# Patient Record
Sex: Male | Born: 1945 | Race: Black or African American | Hispanic: No | Marital: Married | Smoking: Former smoker
Health system: Southern US, Community
[De-identification: ages and names within clinical notes are randomized; demographics above are authoritative.]

## PROBLEM LIST (undated history)

## (undated) DIAGNOSIS — I639 Cerebral infarction, unspecified: Secondary | ICD-10-CM

## (undated) DIAGNOSIS — I10 Essential (primary) hypertension: Secondary | ICD-10-CM

## (undated) DIAGNOSIS — I219 Acute myocardial infarction, unspecified: Secondary | ICD-10-CM

## (undated) DIAGNOSIS — F32A Depression, unspecified: Secondary | ICD-10-CM

## (undated) DIAGNOSIS — R569 Unspecified convulsions: Secondary | ICD-10-CM

## (undated) DIAGNOSIS — M1990 Unspecified osteoarthritis, unspecified site: Secondary | ICD-10-CM

## (undated) DIAGNOSIS — F329 Major depressive disorder, single episode, unspecified: Secondary | ICD-10-CM

## (undated) DIAGNOSIS — I2510 Atherosclerotic heart disease of native coronary artery without angina pectoris: Secondary | ICD-10-CM

## (undated) DIAGNOSIS — K5790 Diverticulosis of intestine, part unspecified, without perforation or abscess without bleeding: Secondary | ICD-10-CM

## (undated) DIAGNOSIS — C801 Malignant (primary) neoplasm, unspecified: Secondary | ICD-10-CM

## (undated) HISTORY — PX: MOUTH SURGERY: SHX715

## (undated) HISTORY — PX: CORONARY ANGIOPLASTY WITH STENT PLACEMENT: SHX49

---

## 2005-07-05 ENCOUNTER — Ambulatory Visit: Payer: Self-pay | Admitting: Internal Medicine

## 2005-07-19 ENCOUNTER — Ambulatory Visit: Payer: Self-pay | Admitting: Internal Medicine

## 2005-11-21 ENCOUNTER — Ambulatory Visit: Payer: Self-pay | Admitting: Family Medicine

## 2009-05-28 DIAGNOSIS — I639 Cerebral infarction, unspecified: Secondary | ICD-10-CM

## 2009-05-28 HISTORY — DX: Cerebral infarction, unspecified: I63.9

## 2011-10-31 ENCOUNTER — Emergency Department (INDEPENDENT_AMBULATORY_CARE_PROVIDER_SITE_OTHER)
Admission: EM | Admit: 2011-10-31 | Discharge: 2011-10-31 | Disposition: A | Discharge status: Nursing Home (Includes ICF) | Payer: Medicare Other | Source: Home / Self Care | Attending: Emergency Medicine | Admitting: Emergency Medicine

## 2011-10-31 ENCOUNTER — Emergency Department (HOSPITAL_COMMUNITY)
Admission: EM | Admit: 2011-10-31 | Discharge: 2011-10-31 | Disposition: A | Discharge status: Home / Self Care | Payer: Medicare Other | Attending: Emergency Medicine | Admitting: Emergency Medicine

## 2011-10-31 ENCOUNTER — Encounter (HOSPITAL_COMMUNITY): Payer: Self-pay

## 2011-10-31 ENCOUNTER — Emergency Department (HOSPITAL_COMMUNITY): Payer: Medicare Other

## 2011-10-31 ENCOUNTER — Encounter (HOSPITAL_COMMUNITY): Payer: Self-pay | Admitting: *Deleted

## 2011-10-31 DIAGNOSIS — N200 Calculus of kidney: Secondary | ICD-10-CM | POA: Insufficient documentation

## 2011-10-31 DIAGNOSIS — I2510 Atherosclerotic heart disease of native coronary artery without angina pectoris: Secondary | ICD-10-CM | POA: Insufficient documentation

## 2011-10-31 DIAGNOSIS — K5732 Diverticulitis of large intestine without perforation or abscess without bleeding: Secondary | ICD-10-CM | POA: Insufficient documentation

## 2011-10-31 DIAGNOSIS — I10 Essential (primary) hypertension: Secondary | ICD-10-CM | POA: Insufficient documentation

## 2011-10-31 DIAGNOSIS — R109 Unspecified abdominal pain: Secondary | ICD-10-CM | POA: Insufficient documentation

## 2011-10-31 DIAGNOSIS — I517 Cardiomegaly: Secondary | ICD-10-CM | POA: Insufficient documentation

## 2011-10-31 DIAGNOSIS — E119 Type 2 diabetes mellitus without complications: Secondary | ICD-10-CM | POA: Insufficient documentation

## 2011-10-31 DIAGNOSIS — K37 Unspecified appendicitis: Secondary | ICD-10-CM

## 2011-10-31 HISTORY — DX: Essential (primary) hypertension: I10

## 2011-10-31 HISTORY — DX: Atherosclerotic heart disease of native coronary artery without angina pectoris: I25.10

## 2011-10-31 LAB — URINALYSIS, ROUTINE W REFLEX MICROSCOPIC
Bilirubin Urine: NEGATIVE
Glucose, UA: NEGATIVE mg/dL
Hgb urine dipstick: NEGATIVE
Ketones, ur: NEGATIVE mg/dL
Protein, ur: NEGATIVE mg/dL
Urobilinogen, UA: 1 mg/dL (ref 0.0–1.0)

## 2011-10-31 LAB — POCT URINALYSIS DIP (DEVICE)
Bilirubin Urine: NEGATIVE
Glucose, UA: NEGATIVE mg/dL
Hgb urine dipstick: NEGATIVE
Nitrite: NEGATIVE
Specific Gravity, Urine: 1.02 (ref 1.005–1.030)
Urobilinogen, UA: 1 mg/dL (ref 0.0–1.0)
pH: 7 (ref 5.0–8.0)

## 2011-10-31 LAB — DIFFERENTIAL
Basophils Relative: 0 % (ref 0–1)
Eosinophils Absolute: 0.3 10*3/uL (ref 0.0–0.7)
Eosinophils Relative: 3 % (ref 0–5)
Lymphs Abs: 3.2 10*3/uL (ref 0.7–4.0)
Monocytes Relative: 7 % (ref 3–12)
Neutrophils Relative %: 56 % (ref 43–77)

## 2011-10-31 LAB — COMPREHENSIVE METABOLIC PANEL
ALT: 19 U/L (ref 0–53)
BUN: 12 mg/dL (ref 6–23)
CO2: 25 mEq/L (ref 19–32)
Calcium: 9.7 mg/dL (ref 8.4–10.5)
Creatinine, Ser: 1.09 mg/dL (ref 0.50–1.35)
GFR calc Af Amer: 81 mL/min — ABNORMAL LOW (ref >90)
GFR calc non Af Amer: 70 mL/min — ABNORMAL LOW (ref >90)
Glucose, Bld: 78 mg/dL (ref 70–99)
Sodium: 137 mEq/L (ref 135–145)
Total Protein: 7.5 g/dL (ref 6.0–8.3)

## 2011-10-31 LAB — CBC
Hemoglobin: 15.5 g/dL (ref 13.0–17.0)
MCH: 32.5 pg (ref 26.0–34.0)
MCHC: 35.1 g/dL (ref 30.0–36.0)
MCV: 92.7 fL (ref 78.0–100.0)
RBC: 4.77 MIL/uL (ref 4.22–5.81)

## 2011-10-31 LAB — LIPASE, BLOOD: Lipase: 36 U/L (ref 11–59)

## 2011-10-31 MED ORDER — METRONIDAZOLE IN NACL 5-0.79 MG/ML-% IV SOLN
500.0000 mg | Freq: Once | INTRAVENOUS | Status: AC
  Administered 2011-10-31: 500 mg via INTRAVENOUS
  Filled 2011-10-31: qty 100

## 2011-10-31 MED ORDER — CIPROFLOXACIN HCL 500 MG PO TABS
500.0000 mg | ORAL_TABLET | Freq: Once | ORAL | Status: AC
  Administered 2011-10-31: 500 mg via ORAL
  Filled 2011-10-31: qty 1

## 2011-10-31 MED ORDER — HYDROMORPHONE HCL PF 1 MG/ML IJ SOLN
1.0000 mg | Freq: Once | INTRAMUSCULAR | Status: AC
  Administered 2011-10-31: 1 mg via INTRAVENOUS
  Filled 2011-10-31: qty 1

## 2011-10-31 MED ORDER — ONDANSETRON HCL 4 MG/2ML IJ SOLN
4.0000 mg | Freq: Once | INTRAMUSCULAR | Status: AC
  Administered 2011-10-31: 4 mg via INTRAVENOUS
  Filled 2011-10-31: qty 2

## 2011-10-31 MED ORDER — IOHEXOL 300 MG/ML  SOLN
100.0000 mL | Freq: Once | INTRAMUSCULAR | Status: AC | PRN
  Administered 2011-10-31: 100 mL via INTRAVENOUS

## 2011-10-31 MED ORDER — CIPROFLOXACIN HCL 500 MG PO TABS: 500.0000 mg | ORAL_TABLET | Freq: Two times a day (BID) | ORAL | Status: AC

## 2011-10-31 MED ORDER — SODIUM CHLORIDE 0.9 % IV SOLN
1000.0000 mL | INTRAVENOUS | Status: DC
  Administered 2011-10-31: 1000 mL via INTRAVENOUS

## 2011-10-31 MED ORDER — SODIUM CHLORIDE 0.9 % IV SOLN
1000.0000 mL | Freq: Once | INTRAVENOUS | Status: AC
  Administered 2011-10-31: 1000 mL via INTRAVENOUS

## 2011-10-31 MED ORDER — METRONIDAZOLE 500 MG PO TABS: 500.0000 mg | ORAL_TABLET | Freq: Two times a day (BID) | ORAL | Status: AC

## 2011-10-31 MED ORDER — IOHEXOL 300 MG/ML  SOLN
20.0000 mL | INTRAMUSCULAR | Status: AC
  Administered 2011-10-31: 20 mL via ORAL

## 2011-10-31 NOTE — ED Notes (Signed)
Pt  Reminded     To  Remain npo

## 2011-10-31 NOTE — ED Notes (Signed)
CT made aware that pt has drank the contrast. Pt aware that we need urine sample.

## 2011-10-31 NOTE — ED Provider Notes (Signed)
  Physical Exam  BP 158/72  Pulse 65  Temp(Src) 98.5 F (36.9 C) (Oral)  Resp 16  SpO2 100%  Physical Exam Patient in no distress.  Awaiting CT scan ED Course  Procedures  MDM Review the CT scan reveals that he has some sigmoid diverticular disease.  Some kidney stones that are sitting in the renal poles or within the bladder, but no obstructing renal calculi.  His urine is negative for any blood.  I discussed this with the patient.  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  Will order nonsteroidals for pain control      Arman Filter, NP 10/31/11 2138

## 2011-10-31 NOTE — ED Provider Notes (Signed)
History   This chart was scribed for Celene Kras, MD by Nicholas Farrell. The patient was seen in room STRE7/STRE7 and the patient's care was started at 7:27PM.  CSN: [id number]  Arrival date & time 10/31/11  1809  Chief Complaint  Patient presents with  . Abdominal Pain   HPI Nicholas Farrell is a 66 y.o. male who presents to the Emergency Department complaining of constant, radiating, moderate to severe abdominal pain with an onset 3 weeks ago. Pain radiates to lower back. Per urgent care notes. Discussed urgent care hx with pt and made following amendments: abd pain is all over, and there was no constipation.  History of Present Illness: The patient is a 66 year old male, resident [name], who presents today with a three-week history of constipation. He had his last bowel movement here at the urgent care Center. He states he has to strain at stool is painful to have bowel movements. His stools have been soft but not diarrheal. He's having one to 2 bowel movements per day without blood or mucus. He also has had a four-day history of intermittent lower abdominal pain, right worse than left. This is described as a burning and is rated as a 7/10 in intensity. It's worse with a bowel movement and better when he lies down. He's felt somewhat nauseated but had no vomiting, fever, or chills.   Past Medical History  Diagnosis Date  . Diabetes mellitus   . Hypertension   . CAD (coronary artery disease)     Past Surgical History  Procedure Date  . Stint     No family history on file.  History  Substance Use Topics  . Smoking status: Former Games developer  . Smokeless tobacco: Not on file  . Alcohol Use: No      Review of Systems 10 Systems reviewed and all are negative for acute change except as noted in the HPI.   Allergies  Review of patient's allergies indicates no known allergies.  Home Medications   Current Outpatient Rx  Name Route Sig Dispense Refill  . ASPIRIN 325 MG PO TABS  Oral Take 325 mg by mouth daily.    Marland Kitchen ESCITALOPRAM OXALATE 10 MG PO TABS Oral Take 10 mg by mouth daily.    Marland Kitchen LISINOPRIL 10 MG PO TABS Oral Take 10 mg by mouth daily.    Marland Kitchen METFORMIN HCL 500 MG PO TABS Oral Take 500 mg by mouth 2 (two) times daily with a meal.    . ADULT MULTIVITAMIN W/MINERALS CH Oral Take 1 tablet by mouth daily.    . TRAZODONE HCL 100 MG PO TABS Oral Take 100 mg by mouth at bedtime.      BP 174/79  Pulse 56  Temp(Src) 98.5 F (36.9 C) (Oral)  Resp 16  SpO2 100%  Physical Exam  Nursing note and vitals reviewed. Constitutional: He appears well-developed and well-nourished. No distress.  HENT:  Head: Normocephalic and atraumatic.  Right Ear: External ear normal.  Left Ear: External ear normal.  Eyes: Conjunctivae are normal. Right eye exhibits no discharge. Left eye exhibits no discharge. No scleral icterus.  Neck: Neck supple. No tracheal deviation present.  Cardiovascular: Normal rate.   Pulmonary/Chest: Effort normal. No stridor. No respiratory distress.  Abdominal: Soft. He exhibits no distension and no mass. There is tenderness (mild epigastric and RLQ). There is no rebound and no guarding.  Musculoskeletal: He exhibits no edema.  Neurological: He is alert. Cranial nerve deficit: no gross deficits.  Skin:  Skin is warm and dry. No rash noted.  Psychiatric: He has a normal mood and affect.    ED Course  Procedures (including critical care time)  DIAGNOSTIC STUDIES: Oxygen Saturation is 99% on room air, normal by my interpretation.    COORDINATION OF CARE:  7:30PM - Urgent Care lab results reviewed. EDMD will order abd CT for the pt.    Labs Reviewed  URINALYSIS, ROUTINE W REFLEX MICROSCOPIC  COMPREHENSIVE METABOLIC PANEL  LIPASE, BLOOD     MDM  On my exam the patient has only mild tenderness in the right lower quadrant. The symptoms are somewhat atypical for appendicitis however the patient was sent down to the emergency room for further testing  to evaluate for appendicitis.  I will add on liver function panels and lipase considering the mild tenderness in the epigastrium. CT abdomen pelvis has been ordered.  The case will be turned over to the oncoming provider to follow up and disposition accordingly.  I personally performed the services described in this documentation, which was scribed in my presence.  The recorded information has been reviewed and considered.         Celene Kras, MD 10/31/11 [phone]

## 2011-10-31 NOTE — ED Notes (Signed)
Pt given contrast to drink. Made aware to notify when complete.

## 2011-10-31 NOTE — ED Notes (Signed)
Lower abdominal pain into  Lower back pain for 3 weeks, denies any vomiting or diarrhea is having nausea

## 2011-10-31 NOTE — ED Notes (Signed)
Pt  Reports  Low  abd  Pain   With  Constipation  -  Had  A  Small  Recently  But  Has  Not  Had  A  Good  One  In about 3  Weeks  -   He  Is  A  Resident  At the  [facility]     He  Has  Been there  About  6 weeks       - he  denys  Any  Vomiting  He  Is  Sitting upright on  Exam table  In no  Severe  Distress

## 2011-10-31 NOTE — Discharge Instructions (Signed)
We have determined that your problem requires further evaluation in the emergency department.  We will take care of your transport there.  Once at the emergency department, you will be evaluated by a provider and they will order whatever treatment or tests they deem necessary.  We cannot guarantee that they will do any specific test or do any specific treatment.    Appendicitis Appendicitis is when the appendix is swollen (inflamed). The inflammation can lead to developing a hole (perforation) and a collection of pus (abscess). CAUSES  There is not always an obvious cause of appendicitis. Sometimes it is caused by an obstruction in the appendix. The obstruction can be caused by:  A small, hard, pea-sized ball of stool (fecalith).   Enlarged lymph glands in the appendix.  SYMPTOMS   Pain around your belly button (navel) that moves toward your lower right belly (abdomen). The pain can become more severe and sharp as time passes.   Tenderness in the lower right abdomen. Pain gets worse if you cough or make a sudden movement.   Feeling sick to your stomach (nauseous).   Throwing up (vomiting).   Loss of appetite.   Fever.   Constipation.   Diarrhea.   Generally not feeling well.  DIAGNOSIS   Physical exam.   Blood tests.   Urine test.   X-rays or a CT scan may confirm the diagnosis.  TREATMENT  Once the diagnosis of appendicitis is made, the most common treatment is to remove the appendix as soon as possible. This procedure is called appendectomy. In an open appendectomy, a cut (incision) is made in the lower right abdomen and the appendix is removed. In a laparoscopic appendectomy, usually 3 small incisions are made. Long, thin instruments and a camera tube are used to remove the appendix. Most patients go home in 24 to 28 hours after appendectomy. In some situations, the appendix may have already perforated and an abscess may have formed. The abscess may have a "wall" around it  as seen on a CT scan. In this case, a drain may be placed into the abscess and you may be treated with medicines (antibiotics) that kill germs. Once the abscess has resolved, it may or may not be necessary to have an appendectomy. Document Released: 05/14/2005 Document Revised: 05/03/2011 Document Reviewed: 08/09/2009 [employer] Patient Information 2012 [employer]. 

## 2011-10-31 NOTE — ED Provider Notes (Signed)
Chief Complaint  Patient presents with  . Constipation    History of Present Illness:   The patient is a 66 year old male, resident [name], who presents today with a three-week history of constipation. He had his last bowel movement here at the urgent care Center. He states he has to strain at stool is painful to have bowel movements. His stools have been soft but not diarrheal. He's having one to 2 bowel movements per day without blood or mucus. He also has had a four-day history of intermittent lower abdominal pain, right worse than left. This is described as a burning and is rated as a 7/10 in intensity. It's worse with a bowel movement and better when he lies down. He's felt somewhat nauseated but had no vomiting, fever, or chills.  Review of Systems:  Other than noted above, the patient denies any of the following symptoms: Constitutional:  No fever, chills, fatigue, weight loss or anorexia. Lungs:  No cough or shortness of breath. Heart:  No chest pain, palpitations, syncope or edema.  No cardiac history. Abdomen:  No nausea, vomiting, hematememesis, melena, diarrhea, or hematochezia. GU:  No dysuria, frequency, urgency, or hematuria.  No testicular pain or swelling. Skin:  No rash or itching.  PMFSH:  Past medical history, family history, social history, meds, and allergies were reviewed.  No prior abdominal surgeries or history of GI problems.  No use of NSAIDs or aspirin.  No excessive  alcohol intake.  Physical Exam:   Vital signs:  BP 167/79  Pulse 80  Temp(Src) 98.3 F (36.8 C) (Oral)  Resp 18  SpO2 99% Gen:  Alert, oriented, in no distress. Lungs:  Breath sounds clear and equal bilaterally.  No wheezes, rales or rhonchi. Heart:  Regular rhythm.  No gallops or murmers.   Abdomen:  Abdomen is soft, flat, nondistended. He has localized tenderness to palpation in the right lower quadrant with guarding and rebound. There is no organomegaly or mass. Bowel sounds are normally  active. Genital exam:  Normal genitalia, no hernia, testes normal. Rectal exam: No masses, tenderness, prostate enlargement, or prostate nodules. Stool was heme-negative. Skin:  Clear, warm and dry.  No rash.  Labs:   Results for orders placed during the hospital encounter of 10/31/11  POCT URINALYSIS DIP (DEVICE)      Component Value Range   Glucose, UA NEGATIVE  NEGATIVE (mg/dL)   Bilirubin Urine NEGATIVE  NEGATIVE    Ketones, ur NEGATIVE  NEGATIVE (mg/dL)   Specific Gravity, Urine 1.020  1.005 - 1.030    Hgb urine dipstick NEGATIVE  NEGATIVE    pH 7.0  5.0 - 8.0    Protein, ur 30 (*) NEGATIVE (mg/dL)   Urobilinogen, UA 1.0  0.0 - 1.0 (mg/dL)   Nitrite NEGATIVE  NEGATIVE    Leukocytes, UA NEGATIVE  NEGATIVE   CBC      Component Value Range   WBC 9.5  4.0 - 10.5 (K/uL)   RBC 4.77  4.22 - 5.81 (MIL/uL)   Hemoglobin 15.5  13.0 - 17.0 (g/dL)   HCT 16.1  09.6 - 04.5 (%)   MCV 92.7  78.0 - 100.0 (fL)   MCH 32.5  26.0 - 34.0 (pg)   MCHC 35.1  30.0 - 36.0 (g/dL)   RDW 40.9  81.1 - 91.4 (%)   Platelets 173  150 - 400 (K/uL)  DIFFERENTIAL      Component Value Range   Neutrophils Relative 56  43 - 77 (%)  Neutro Abs 5.3  1.7 - 7.7 (K/uL)   Lymphocytes Relative 34  12 - 46 (%)   Lymphs Abs 3.2  0.7 - 4.0 (K/uL)   Monocytes Relative 7  3 - 12 (%)   Monocytes Absolute 0.7  0.1 - 1.0 (K/uL)   Eosinophils Relative 3  0 - 5 (%)   Eosinophils Absolute 0.3  0.0 - 0.7 (K/uL)   Basophils Relative 0  0 - 1 (%)   Basophils Absolute 0.0  0.0 - 0.1 (K/uL)    Assessment:  The encounter diagnosis was Appendicitis.  Plan:   1.  The following meds were prescribed:   New Prescriptions   No medications on file   2.  The patient was transported to the emergency department via shuttle.  Reuben Likes, MD 10/31/11 [phone]

## 2011-10-31 NOTE — Discharge Instructions (Signed)
Low Fiber and Residue Restricted Diet A low fiber diet restricts foods that contain carbohydrates that are not digested in the small intestine. A diet containing about 10 g of fiber is considered low fiber. The diet needs to be individualized to suit patient tolerances and preferences and to avoid unnecessary restrictions. Generally, the foods emphasized in a low fiber diet have no skins or seeds. They may have been processed to remove bran, germ, or husks. Cooking may not necessarily eliminate the fiber. Cooking may, in fact, enable a greater quantity of fiber to be consumed in a lesser volume. Legumes and nuts are also restricted. The term low residue has also been used to describe low fiber diets, although the two are not the same. Residue refers to any substance that adds to bowel (colonic) contents, such as sloughed cells and intestinal bacteria, in addition to fiber. Residue-containing foods, prunes and prune juice, milk, and connective tissue from meats may also need to be eliminated. It is important to eliminate these foods during sudden (acute) attacks of inflammatory bowel disease, when there is a partial obstruction due to another reason, or when minimal fecal output is desired. When these problems are gone, a more normal diet may be used. PURPOSE  Prevent blockage of a partially obstructed or narrowed gastrointestinal tract.   Reduce stool weight and volume.   Slow the movement of waste.  WHEN IS THIS DIET USED?  Acute phase of Crohn's disease, ulcerative colitis, regional enteritis, or diverticulitis.   Narrowing (stenosis) of intestinal or esophageal tubes (lumina).   Transitional diet following surgery, injury (trauma), or illness.  ADEQUACY This diet is nutritionally adequate based on individual food choices according to the Recommended Dietary Allowances of the National Research Council. CHOOSING FOODS Check labels, especially on foods from the starch list. Often, dietary fiber  content is listed with the Nutrition Facts panel.  Breads and Starches  Allowed: White, French, and pita breads, plain rolls, buns, or sweet rolls, doughnuts, waffles, pancakes, bagels. Plain muffins, sweet breads, biscuits, matzoth. Flour. Soda, saltine, or graham crackers. Pretzels, rusks, melba toast, zwieback. Cooked cereals: cornmeal, farina, cream cereals. Dry cereals: refined corn, wheat, rice, and oat cereals (check label). Potatoes prepared any way without skins, refined macaroni, spaghetti, noodles, refined rice.   Avoid: Bread, rolls, or crackers made with whole-wheat, multigrains, rye, bran seeds, nuts, or coconut. Corn tortillas, table-shells. Corn chips, tortilla chips. Cereals containing whole-grains, multigrains, bran, coconut, nuts, or raisins. Cooked or dry oatmeal. Coarse wheat cereals, granola. Cereals advertised as "high fiber." Potato skins. Whole-grain pasta, wild or brown rice. Popcorn.  Vegetables  Allowed:  Strained tomato and vegetable juices. Fresh: tender lettuce, cucumber, cabbage, spinach, bean sprouts. Cooked, canned: asparagus, bean sprouts, cut green or wax beans, cauliflower, pumpkin, beets, mushrooms, olives, spinach, yellow squash, tomato, tomato sauce (no seeds), zucchini (peeled), turnips. Canned sweet potatoes. Small amounts of celery, onion, radish, and green pepper may be used. Keep servings limited to  cup.   Avoid: Fresh, cooked, or canned: artichokes, baked beans, beet greens, broccoli, Brussels sprouts, French-style green beans, corn, kale, legumes, peas, sweet potatoes. Cooked: green or red cabbage, spinach. Avoid large servings of any vegetables.  Fruit  Allowed:  All fruit juices except prune juice. Cooked or canned: apricots applesauce, cantaloupe, cherries, grapefruit, grapes, kiwi, mandarin oranges, peaches, pears, fruit cocktail, pineapple, plums, watermelon. Fresh: banana, grapes, cantaloupe, avocado, cherries, pineapple, grapefruit, kiwi,  nectarines, peaches, oranges, blueberries, plums. Keep servings limited to  cup or 1 piece.     Avoid: Fresh: apple with or without skin, apricots, mango, pears, raspberries, strawberries. Prune juice, stewed or dried prunes. Dried fruits, raisins, dates. Avoid large servings of all fresh fruits.  Meat and Meat Substitutes  Allowed:  Ground or well-cooked tender beef, ham, veal, lamb, pork, or poultry. Eggs, plain cheese. Fish, oysters, shrimp, lobster, other seafood. Liver, organ meats.   Avoid: Tough, fibrous meats with gristle. Peanut butter, smooth or chunky. Cheese with seeds, nuts, or other foods not allowed. Nuts, seeds, legumes, dried peas, beans, lentils.  Milk  Allowed:  All milk products except those not allowed. Milk and milk product consumption should be minimal when low residue is desired.   Avoid: Yogurt that contains nuts or seeds.  Soups and Combination Foods  Allowed:  Bouillon, broth, or cream soups made from allowed foods. Any strained soup. Casseroles or mixed dishes made with allowed foods.   Avoid: Soups made from vegetables that are not allowed or that contain other foods not allowed.  Desserts and Sweets  Allowed:  Plain cakes and cookies, pie made with allowed fruit, pudding, custard, cream pie. Gelatin, fruit, ice, sherbet, frozen ice pops. Ice cream, ice milk without nuts. Plain hard candy, honey, jelly, molasses, syrup, sugar, chocolate syrup, gumdrops, marshmallows.   Avoid: Desserts, cookies, or candies that contain nuts, peanut butter, or dried fruits. Jams, preserves with seeds, marmalade.  Fats and Oils  Allowed:  Margarine, butter, cream, mayonnaise, salad oils, plain salad dressings made from allowed foods. Plain gravy, crisp bacon without rind.   Avoid: Seeds, nuts, olives. Avocados.  Beverages  Allowed:  All, except those listed to avoid.   Avoid: Fruit juices with high pulp, prune juice.  Condiments  Allowed:  Ketchup, mustard, horseradish,  vinegar, cream sauce, cheese sauce, cocoa powder. Spices in moderation: allspice, basil, bay leaves, celery powder or leaves, cinnamon, cumin powder, curry powder, ginger, mace, marjoram, onion or garlic powder, oregano, paprika, parsley flakes, ground pepper, rosemary, sage, savory, tarragon, thyme, turmeric.   Avoid: Coconut, pickles.  SAMPLE MEAL PLAN The following menu is provided as a sample. Your daily menu plans will vary. Be sure to include a minimum of the following each day in order to provide essential nutrients for the adult:  Starch/Bread/Cereal Group, 6 servings.   Fruit/Vegetable Group, 5 servings.   Meat/Meat Substitute Group, 2 servings.   Milk/Milk Substitute Group, 2 servings.  A serving is equal to  cup for fruits, vegetables, and cooked cereals or 1 piece for foods such as a piece of bread, 1 orange, or 1 apple. For dry cereals and crackers, use serving sizes listed on the label. Combination foods may count as full or partial servings from various food groups. Fats, desserts, and sweets may be added to the meal plan after the requirements for essential nutrients are met. SAMPLE MENU Breakfast   cup orange juice.   1 boiled egg.   1 slice white toast.   Margarine.    cup cornflakes.   1 cup milk.   Beverage.  Lunch   cup chicken noodle soup.   2 to 3 oz sliced roast beef.   2 slices seedless rye bread.   Mayonnaise.    cup tomato juice.   1 small banana.   Beverage.  Dinner  3 oz baked chicken.    cup scalloped potatoes.    cup cooked beets.   White dinner roll.   Margarine.    cup canned peaches.   Beverage.  Document Released: 11/03/2001 Document Revised: 05/03/2011   Document Reviewed: 04/16/2011 [facility] Patient Information 2012 [place], [place].Diverticulitis Small pockets or "bubbles" can develop in the wall of the intestine. Diverticulitis is when those pockets become infected and inflamed. This causes stomach pain (usually  on the left side). HOME CARE  Take all medicine as told by your doctor.   Try a clear liquid diet (broth, tea, or water) for as long as told by your doctor.   Keep all follow-up visits with your doctor.   You may be put on a low-fiber diet once you start feeling better. Here are foods that have low-fiber:   White breads, cereals, rice, and pasta.   Cooked fruits and vegetables or soft fresh fruits and vegetables without the skin.   Ground or well-cooked tender beef, ham, veal, lamb, pork, or poultry.   Eggs and seafood.   After you are doing well on the low-fiber diet, you may be put on a high-fiber diet. Here are ways to increase your fiber:   Choose whole-grain breads, cereals, pasta, and brown rice.   Choose fruits and vegetables with skin on. Do not overcook the vegetables.   Choose nuts, seeds, legumes, dried peas, beans, and lentils.   Look for food products that have more than 3 grams of fiber per serving on the food label.  GET HELP RIGHT AWAY IF:  Your pain does not get better or gets worse.   You have trouble eating food.   You are not pooping (having bowel movements) like normal.   You have a temperature by mouth above 102 F (38.9 C), not controlled by medicine.   You keep throwing up (vomiting).   You have bloody or black, tarry poop (stools).   You are getting worse and not better.  MAKE SURE YOU:   Understand these instructions.   Will watch your condition.   Will get help right away if you are not doing well or get worse.  Document Released: 10/31/2007 Document Revised: 05/03/2011 Document Reviewed: 04/04/2009 [facility] Patient Information 2012 [place], [place]. As discussed.  She also had multiple kidney stones within your urinary system.  Nothing is obstructing any of one large stone sitting in your bladder, which cannot push any problemsKidney Stones Kidney stones (ureteral lithiasis) are deposits that form inside your kidneys. The intense pain  is caused by the stone moving through the urinary tract. When the stone moves, the ureter goes into spasm around the stone. The stone is usually passed in the urine.  CAUSES   A disorder that makes certain neck glands produce too much parathyroid hormone (primary hyperparathyroidism).   A buildup of uric acid crystals.   Narrowing (stricture) of the ureter.   A kidney obstruction present at birth (congenital obstruction).   Previous surgery on the kidney or ureters.   Numerous kidney infections.  SYMPTOMS   Feeling sick to your stomach (nauseous).   Throwing up (vomiting).   Blood in the urine (hematuria).   Pain that usually spreads (radiates) to the groin.   Frequency or urgency of urination.  DIAGNOSIS   Taking a history and physical exam.   Blood or urine tests.   Computerized X-ray scan (CT scan).   Occasionally, an examination of the inside of the urinary bladder (cystoscopy) is performed.  TREATMENT   Observation.   Increasing your fluid intake.   Surgery may be needed if you have severe pain or persistent obstruction.  The size, location, and chemical composition are all important variables that will determine the proper choice of  action for you. Talk to your caregiver to better understand your situation so that you will minimize the risk of injury to yourself and your kidney.  HOME CARE INSTRUCTIONS   Drink enough water and fluids to keep your urine clear or pale yellow.   Strain all urine through the provided strainer. Keep all particulate matter and stones for your caregiver to see. The stone causing the pain may be as small as a grain of salt. It is very important to use the strainer each and every time you pass your urine. The collection of your stone will allow your caregiver to analyze it and verify that a stone has actually passed.   Only take over-the-counter or prescription medicines for pain, discomfort, or fever as directed by your caregiver.    Make a follow-up appointment with your caregiver as directed.   Get follow-up X-rays if required. The absence of pain does not always mean that the stone has passed. It may have only stopped moving. If the urine remains completely obstructed, it can cause loss of kidney function or even complete destruction of the kidney. It is your responsibility to make sure X-rays and follow-ups are completed. Ultrasounds of the kidney can show blockages and the status of the kidney. Ultrasounds are not associated with any radiation and can be performed easily in a matter of minutes.  SEEK IMMEDIATE MEDICAL CARE IF:   Pain cannot be controlled with the prescribed medicine.   You have a fever.   The severity or intensity of pain increases over 18 hours and is not relieved by pain medicine.   You develop a new onset of abdominal pain.   You feel faint or pass out.  MAKE SURE YOU:   Understand these instructions.   Will watch your condition.   Will get help right away if you are not doing well or get worse.  Document Released: 05/14/2005 Document Revised: 05/03/2011 Document Reviewed: 09/09/2009 [facility] Patient Information 2012 [place], [place].

## 2011-11-01 NOTE — ED Provider Notes (Signed)
Medical screening examination/treatment/procedure(s) were conducted as a shared visit with non-physician practitioner(s) and myself.  I personally evaluated the patient during the encounter   Celene Kras, MD 11/01/11 1104

## 2011-11-02 ENCOUNTER — Encounter (HOSPITAL_COMMUNITY): Payer: Self-pay | Admitting: Emergency Medicine

## 2011-11-02 ENCOUNTER — Emergency Department (HOSPITAL_COMMUNITY)
Admission: EM | Admit: 2011-11-02 | Discharge: 2011-11-02 | Disposition: A | Discharge status: Home / Self Care | Payer: Medicare Other | Attending: Emergency Medicine | Admitting: Emergency Medicine

## 2011-11-02 DIAGNOSIS — N200 Calculus of kidney: Secondary | ICD-10-CM | POA: Insufficient documentation

## 2011-11-02 DIAGNOSIS — Z79899 Other long term (current) drug therapy: Secondary | ICD-10-CM | POA: Insufficient documentation

## 2011-11-02 DIAGNOSIS — I10 Essential (primary) hypertension: Secondary | ICD-10-CM | POA: Insufficient documentation

## 2011-11-02 DIAGNOSIS — I2510 Atherosclerotic heart disease of native coronary artery without angina pectoris: Secondary | ICD-10-CM | POA: Insufficient documentation

## 2011-11-02 DIAGNOSIS — E119 Type 2 diabetes mellitus without complications: Secondary | ICD-10-CM | POA: Insufficient documentation

## 2011-11-02 DIAGNOSIS — K5792 Diverticulitis of intestine, part unspecified, without perforation or abscess without bleeding: Secondary | ICD-10-CM

## 2011-11-02 DIAGNOSIS — K5732 Diverticulitis of large intestine without perforation or abscess without bleeding: Secondary | ICD-10-CM | POA: Insufficient documentation

## 2011-11-02 LAB — CBC
HCT: 43.7 % (ref 39.0–52.0)
MCH: 32.5 pg (ref 26.0–34.0)
MCV: 91 fL (ref 78.0–100.0)
RBC: 4.8 MIL/uL (ref 4.22–5.81)
WBC: 9.1 10*3/uL (ref 4.0–10.5)

## 2011-11-02 LAB — URINALYSIS, ROUTINE W REFLEX MICROSCOPIC
Hgb urine dipstick: NEGATIVE
Nitrite: NEGATIVE
Protein, ur: NEGATIVE mg/dL
Specific Gravity, Urine: 1.028 (ref 1.005–1.030)
Urobilinogen, UA: 0.2 mg/dL (ref 0.0–1.0)

## 2011-11-02 LAB — BASIC METABOLIC PANEL
CO2: 24 mEq/L (ref 19–32)
Chloride: 102 mEq/L (ref 96–112)
Creatinine, Ser: 1.03 mg/dL (ref 0.50–1.35)
Glucose, Bld: 80 mg/dL (ref 70–99)
Sodium: 136 mEq/L (ref 135–145)

## 2011-11-02 LAB — URINE MICROSCOPIC-ADD ON

## 2011-11-02 MED ORDER — CIPROFLOXACIN IN D5W 400 MG/200ML IV SOLN
400.0000 mg | Freq: Once | INTRAVENOUS | Status: AC
  Administered 2011-11-02: 400 mg via INTRAVENOUS
  Filled 2011-11-02: qty 200

## 2011-11-02 MED ORDER — METRONIDAZOLE IN NACL 5-0.79 MG/ML-% IV SOLN
500.0000 mg | Freq: Once | INTRAVENOUS | Status: AC
  Administered 2011-11-02: 500 mg via INTRAVENOUS
  Filled 2011-11-02: qty 100

## 2011-11-02 NOTE — ED Provider Notes (Signed)
History     CSN: [id number]  Arrival date & time 11/02/11  1514   First MD Initiated Contact with Patient 11/02/11 1926      Chief Complaint  Patient presents with  . Abdominal Pain    (Consider location/radiation/quality/duration/timing/severity/associated sxs/prior treatment) HPI  66 year old male with history of diabetes, history of hypertension, and a recent diagnosis of diverticulitis and nonobstructed kidney stones presents complaining of low abdominal pain. Patient states for the past 3 weeks he has been having intermittent pain to his abdomen. Pain is associated with intermittent diarrhea. Patient denies fever, chills, nausea, vomiting, urinary symptoms. He was seen in the ED 2 days ago for evaluation of his symptoms. He was found to have evidence of acute early onset of diverticulitis. He was given Cipro and Flagyl as treatment. Since patient is staying at a drug rehabilitation facility, no narcotic pain medication will prescribe. Patient states he has been taking his medication for the past 2 days but has not noticed any improvement. He denies any other changes since he was last seen. He is able to target by mouth. His last bowel movement was earlier today, and it was normal.     Past Medical History  Diagnosis Date  . Diabetes mellitus   . Hypertension   . CAD (coronary artery disease)     Past Surgical History  Procedure Date  . Stint     History reviewed. No pertinent family history.  History  Substance Use Topics  . Smoking status: Former Smoker    Quit date: 09/24/2011  . Smokeless tobacco: Not on file  . Alcohol Use: No      Review of Systems  All other systems reviewed and are negative.    Allergies  Review of patient's allergies indicates no known allergies.  Home Medications   Current Outpatient Rx  Name Route Sig Dispense Refill  . ASPIRIN 325 MG PO TABS Oral Take 325 mg by mouth daily.    Marland Kitchen CIPROFLOXACIN HCL 500 MG PO TABS Oral Take 1  tablet (500 mg total) by mouth 2 (two) times daily. 20 tablet 0  . ESCITALOPRAM OXALATE 10 MG PO TABS Oral Take 10 mg by mouth daily.    Marland Kitchen LISINOPRIL 10 MG PO TABS Oral Take 10 mg by mouth daily.    Marland Kitchen METFORMIN HCL 500 MG PO TABS Oral Take 500 mg by mouth 2 (two) times daily with a meal.    . METRONIDAZOLE 500 MG PO TABS Oral Take 1 tablet (500 mg total) by mouth 2 (two) times daily. 14 tablet 0  . ADULT MULTIVITAMIN W/MINERALS CH Oral Take 1 tablet by mouth daily.    . TRAZODONE HCL 100 MG PO TABS Oral Take 100 mg by mouth at bedtime.      BP 128/69  Pulse 72  Temp(Src) 98.3 F (36.8 C) (Oral)  Resp 18  SpO2 98%  Physical Exam  Nursing note and vitals reviewed. Constitutional: He is oriented to person, place, and time. He appears well-developed and well-nourished. No distress.  HENT:  Head: Normocephalic and atraumatic.  Mouth/Throat: No oropharyngeal exudate.  Eyes: Conjunctivae are normal.  Neck: Neck supple.  Cardiovascular: Regular rhythm.  Exam reveals no gallop and no friction rub.   No murmur heard. Pulmonary/Chest: Effort normal and breath sounds normal. No respiratory distress. He has no wheezes. He exhibits no tenderness.  Abdominal: Soft. There is generalized tenderness. There is no rigidity, no guarding, no CVA tenderness, no tenderness at McBurney's point and negative  Murphy's sign. No hernia. Hernia confirmed negative in the ventral area.  Musculoskeletal: Normal range of motion.  Neurological: He is alert and oriented to person, place, and time.  Skin: Skin is warm. No rash noted.    ED Course  Procedures (including critical care time)  Labs Reviewed  URINALYSIS, ROUTINE W REFLEX MICROSCOPIC - Abnormal; Notable for the following:    Leukocytes, UA SMALL (*)    All other components within normal limits  URINE MICROSCOPIC-ADD ON  CBC  BASIC METABOLIC PANEL   Ct Abdomen Pelvis W Contrast  10/31/2011  *RADIOLOGY REPORT*  Clinical Data: Mid to lower abdominal  pain.  CT ABDOMEN AND PELVIS WITH CONTRAST  Technique:  Multidetector CT imaging of the abdomen and pelvis was performed following the standard protocol during bolus administration of intravenous contrast.  Contrast: OMNIPAQUE IOHEXOL 300 MG/ML  SOLN  Comparison: No priors.  Findings:  Lung Bases: Atherosclerotic calcifications in the left anterior descending, left circumflex and right coronary arteries. Mild cardiomegaly.  Otherwise, unremarkable.  Abdomen/Pelvis:  Enhanced appearance of the liver, gallbladder, spleen and bilateral adrenal glands is unremarkable.  There are some punctate calcifications in the tail of the pancreas which are nonspecific, but are favored to represent sequelae of chronic pancreatitis.  The remainder the pancreas is otherwise unremarkable in appearance.  There are multiple tiny calcifications in the collecting systems of the kidneys bilaterally, favored to represent nonobstructive calculi.  The largest of these is in the interpolar region of the right kidney measuring 4 mm.  A small splenule is incidentally noted.  There is atherosclerosis of the abdominal and pelvic vasculature, without definite focal aneurysm or dissection.  The appendix is normal.  There is no ascites or pneumoperitoneum and no pathologic distension of bowel.  No definite pathologic lymphadenopathy identified within the abdomen or pelvis.  However, there is extensive colonic diverticulosis, most severe in the descending colon and sigmoid colon.  Notably, there is extensive wall thickening throughout the sigmoid colon, particularly proximally, and there is some subtle inflammatory changes in the associated sigmoid mesocolon (best demonstrated on image 60 of series 2), suggestive of early acute diverticulitis.  No definite diverticular abscess or evidence to suggest perforation at this time.  Prostate is unremarkable in appearance.  In the left side of the urinary bladder dependently there is a 9 mm calculus.   Musculoskeletal: There are no aggressive appearing lytic or blastic lesions noted in the visualized portions of the skeleton.  IMPRESSION: 1.  Findings, as above, compatible with early acute diverticulitis in the proximal sigmoid colon. 2.  The multiple bilateral nonobstructive calculi are noted within the collecting systems of the kidneys.  In addition, there is a 9 mm calculus in the left side of the urinary bladder.  3. Atherosclerosis, including left anterior descending, left circumflex and right coronary artery disease. Please note that although the presence of coronary artery calcium documents the presence of coronary artery disease, the severity of this disease and any potential stenosis cannot be assessed on this non-gated CT examination.  Assessment for potential risk factor modification, dietary therapy or pharmacologic therapy may be warranted, if clinically indicated. 4.  Mild cardiomegaly.  Original Report Authenticated By: Florencia Reasons, M.D.     No diagnosis found.  Results for orders placed during the hospital encounter of 11/02/11  URINALYSIS, ROUTINE W REFLEX MICROSCOPIC      Component Value Range   Color, Urine YELLOW  YELLOW    APPearance CLEAR  CLEAR  Specific Gravity, Urine 1.028  1.005 - 1.030    pH 5.0  5.0 - 8.0    Glucose, UA NEGATIVE  NEGATIVE (mg/dL)   Hgb urine dipstick NEGATIVE  NEGATIVE    Bilirubin Urine NEGATIVE  NEGATIVE    Ketones, ur NEGATIVE  NEGATIVE (mg/dL)   Protein, ur NEGATIVE  NEGATIVE (mg/dL)   Urobilinogen, UA 0.2  0.0 - 1.0 (mg/dL)   Nitrite NEGATIVE  NEGATIVE    Leukocytes, UA SMALL (*) NEGATIVE   URINE MICROSCOPIC-ADD ON      Component Value Range   Squamous Epithelial / LPF RARE  RARE    WBC, UA 0-2  <3 (WBC/hpf)   RBC / HPF 0-2  <3 (RBC/hpf)   Bacteria, UA RARE  RARE    Urine-Other MUCOUS PRESENT    BASIC METABOLIC PANEL      Component Value Range   Sodium 136  135 - 145 (mEq/L)   Potassium 3.7  3.5 - 5.1 (mEq/L)   Chloride 102   96 - 112 (mEq/L)   CO2 24  19 - 32 (mEq/L)   Glucose, Bld 80  70 - 99 (mg/dL)   BUN 11  6 - 23 (mg/dL)   Creatinine, Ser 7.82  0.50 - 1.35 (mg/dL)   Calcium 9.5  8.4 - 95.6 (mg/dL)   GFR calc non Af Amer 75 (*) >90 (mL/min)   GFR calc Af Amer 87 (*) >90 (mL/min)  CBC      Component Value Range   WBC 9.1  4.0 - 10.5 (K/uL)   RBC 4.80  4.22 - 5.81 (MIL/uL)   Hemoglobin 15.6  13.0 - 17.0 (g/dL)   HCT 21.3  08.6 - 57.8 (%)   MCV 91.0  78.0 - 100.0 (fL)   MCH 32.5  26.0 - 34.0 (pg)   MCHC 35.7  30.0 - 36.0 (g/dL)   RDW 46.9  62.9 - 52.8 (%)   Platelets 186  150 - 400 (K/uL)   Ct Abdomen Pelvis W Contrast  10/31/2011  *RADIOLOGY REPORT*  Clinical Data: Mid to lower abdominal pain.  CT ABDOMEN AND PELVIS WITH CONTRAST  Technique:  Multidetector CT imaging of the abdomen and pelvis was performed following the standard protocol during bolus administration of intravenous contrast.  Contrast: OMNIPAQUE IOHEXOL 300 MG/ML  SOLN  Comparison: No priors.  Findings:  Lung Bases: Atherosclerotic calcifications in the left anterior descending, left circumflex and right coronary arteries. Mild cardiomegaly.  Otherwise, unremarkable.  Abdomen/Pelvis:  Enhanced appearance of the liver, gallbladder, spleen and bilateral adrenal glands is unremarkable.  There are some punctate calcifications in the tail of the pancreas which are nonspecific, but are favored to represent sequelae of chronic pancreatitis.  The remainder the pancreas is otherwise unremarkable in appearance.  There are multiple tiny calcifications in the collecting systems of the kidneys bilaterally, favored to represent nonobstructive calculi.  The largest of these is in the interpolar region of the right kidney measuring 4 mm.  A small splenule is incidentally noted.  There is atherosclerosis of the abdominal and pelvic vasculature, without definite focal aneurysm or dissection.  The appendix is normal.  There is no ascites or pneumoperitoneum and  no pathologic distension of bowel.  No definite pathologic lymphadenopathy identified within the abdomen or pelvis.  However, there is extensive colonic diverticulosis, most severe in the descending colon and sigmoid colon.  Notably, there is extensive wall thickening throughout the sigmoid colon, particularly proximally, and there is some subtle inflammatory changes in the  associated sigmoid mesocolon (best demonstrated on image 60 of series 2), suggestive of early acute diverticulitis.  No definite diverticular abscess or evidence to suggest perforation at this time.  Prostate is unremarkable in appearance.  In the left side of the urinary bladder dependently there is a 9 mm calculus.  Musculoskeletal: There are no aggressive appearing lytic or blastic lesions noted in the visualized portions of the skeleton.  IMPRESSION: 1.  Findings, as above, compatible with early acute diverticulitis in the proximal sigmoid colon. 2.  The multiple bilateral nonobstructive calculi are noted within the collecting systems of the kidneys.  In addition, there is a 9 mm calculus in the left side of the urinary bladder.  3. Atherosclerosis, including left anterior descending, left circumflex and right coronary artery disease. Please note that although the presence of coronary artery calcium documents the presence of coronary artery disease, the severity of this disease and any potential stenosis cannot be assessed on this non-gated CT examination.  Assessment for potential risk factor modification, dietary therapy or pharmacologic therapy may be warranted, if clinically indicated. 4.  Mild cardiomegaly.  Original Report Authenticated By: Florencia Reasons, M.D.      MDM  Patient with recent diagnosis of diverticulitis. He was treated with Cipro and Flagyl. He returns due to no improvement in symptoms. On exam, he is in no acute distress, abdomen is mildly tender but nonsurgical. He is afebrile with stable normal vital signs.  His labs are unremarkable. UA result is insignificant.  Since patient staying at a drug rehabilitation facility, I agrees that no narcotic pain medication should be given. Will give patient IV antibiotic in the ED, with encouragement for him to continue his current treatment at home. Patient voiced understanding and agrees with plan.        Fayrene Helper, PA-C 11/02/11 2129

## 2011-11-02 NOTE — ED Notes (Signed)
Updated pt on delay.

## 2011-11-02 NOTE — ED Notes (Signed)
Pt stated that he was just at the ED  A couple of days. He stated that he was having generalized abdominal pain that was aching. He also was having nausea. No vomiting or loose stools. He stated that he was diagnosing with kidney stones and diverticulitis. He said they told him to come back if the abdominal pain did not go away. He stated that the pain went away for a couple of hours after discharge. However, the pain has came back intermittently. Currently pain is 7 out of 10. No n/v/d. No cardiac or respiratory distress. Will continue to monitor.

## 2011-11-02 NOTE — Discharge Instructions (Signed)
Please continue taking your prescribed antibiotic for the full duration.  Do not drink any alcohol while taking your antibiotic as it can make you sick.    Diverticulitis Small pockets or "bubbles" can develop in the wall of the intestine. Diverticulitis is when those pockets become infected and inflamed. This causes stomach pain (usually on the left side). HOME CARE  Take all medicine as told by your doctor.   Try a clear liquid diet (broth, tea, or water) for as long as told by your doctor.   Keep all follow-up visits with your doctor.   You may be put on a low-fiber diet once you start feeling better. Here are foods that have low-fiber:   White breads, cereals, rice, and pasta.   Cooked fruits and vegetables or soft fresh fruits and vegetables without the skin.   Ground or well-cooked tender beef, ham, veal, lamb, pork, or poultry.   Eggs and seafood.   After you are doing well on the low-fiber diet, you may be put on a high-fiber diet. Here are ways to increase your fiber:   Choose whole-grain breads, cereals, pasta, and brown rice.   Choose fruits and vegetables with skin on. Do not overcook the vegetables.   Choose nuts, seeds, legumes, dried peas, beans, and lentils.   Look for food products that have more than 3 grams of fiber per serving on the food label.  GET HELP RIGHT AWAY IF:  Your pain does not get better or gets worse.   You have trouble eating food.   You are not pooping (having bowel movements) like normal.   You have a temperature by mouth above 102 F (38.9 C), not controlled by medicine.   You keep throwing up (vomiting).   You have bloody or black, tarry poop (stools).   You are getting worse and not better.  MAKE SURE YOU:   Understand these instructions.   Will watch your condition.   Will get help right away if you are not doing well or get worse.  Document Released: 10/31/2007 Document Revised: 05/03/2011 Document Reviewed:  04/04/2009 [facility] Patient Information 2012 [place], [place].

## 2011-11-02 NOTE — ED Notes (Signed)
Pt concerned that he has not seen a doctor yet and others have gone back. Informed that the longest wait time was 3.5 hours. Pt becoming agitated

## 2011-11-02 NOTE — ED Notes (Signed)
Pt having lower abd pain X 3 weeks on and off described as aching; was here a few days ago and was diagnosed with diverticulitis and kidney stones; given cipro and flagyl and reports not feeling any better; denies n/v/dysuria, does report intermittent diarrhea

## 2011-11-03 NOTE — ED Provider Notes (Signed)
Medical screening examination/treatment/procedure(s) were performed by non-physician practitioner and as supervising physician I was immediately available for consultation/collaboration.   Carleene Cooper III, MD 11/03/11 1355

## 2012-01-17 DIAGNOSIS — E1165 Type 2 diabetes mellitus with hyperglycemia: Secondary | ICD-10-CM | POA: Diagnosis present

## 2012-01-18 ENCOUNTER — Encounter (HOSPITAL_COMMUNITY): Payer: Self-pay

## 2012-01-18 ENCOUNTER — Emergency Department (INDEPENDENT_AMBULATORY_CARE_PROVIDER_SITE_OTHER)
Admission: EM | Admit: 2012-01-18 | Discharge: 2012-01-18 | Disposition: A | Discharge status: Home / Self Care | Payer: Medicare Other | Source: Home / Self Care | Attending: Emergency Medicine | Admitting: Emergency Medicine

## 2012-01-18 DIAGNOSIS — E119 Type 2 diabetes mellitus without complications: Secondary | ICD-10-CM

## 2012-01-18 MED ORDER — GLUCOSE BLOOD VI STRP: ORAL_STRIP | Status: DC

## 2012-01-18 MED ORDER — GLUCOSE BLOOD VI STRP: ORAL_STRIP | Status: AC

## 2012-01-18 MED ORDER — METFORMIN HCL 500 MG PO TABS: 500.0000 mg | ORAL_TABLET | Freq: Two times a day (BID) | ORAL | Status: DC

## 2012-01-18 MED ORDER — TRAZODONE HCL 100 MG PO TABS: 100.0000 mg | ORAL_TABLET | Freq: Every day | ORAL | Status: DC

## 2012-01-18 MED ORDER — ESCITALOPRAM OXALATE 10 MG PO TABS: 10.0000 mg | ORAL_TABLET | Freq: Every day | ORAL | Status: DC

## 2012-01-18 NOTE — ED Provider Notes (Signed)
History     CSN: [id number]  Arrival date & time 01/18/12  1414   None     Chief Complaint  Patient presents with  . Medication Refill    (Consider location/radiation/quality/duration/timing/severity/associated sxs/prior treatment) Patient is a 66 y.o. male presenting with diabetes problem. The history is provided by the patient. No language interpreter was used.  Diabetes He has type 2 diabetes mellitus. There are no hypoglycemic associated symptoms. There are no diabetic associated symptoms.  Pt is at [facility] house and needs rx for lexapro, trazadone, metformin and monitoring supplys.  Pt reports he lives in [place] but is here for treatment.  Past Medical History  Diagnosis Date  . Diabetes mellitus   . Hypertension   . CAD (coronary artery disease)     Past Surgical History  Procedure Date  . Stint   . Coronary angioplasty with stent placement     No family history on file.  History  Substance Use Topics  . Smoking status: Former Smoker    Quit date: 09/24/2011  . Smokeless tobacco: Not on file  . Alcohol Use: No      Review of Systems  All other systems reviewed and are negative.    Allergies  Review of patient's allergies indicates no known allergies.  Home Medications   Current Outpatient Rx  Name Route Sig Dispense Refill  . ESCITALOPRAM OXALATE 10 MG PO TABS Oral Take 10 mg by mouth daily.    Marland Kitchen METFORMIN HCL 500 MG PO TABS Oral Take 500 mg by mouth 2 (two) times daily with a meal.    . TRAZODONE HCL 100 MG PO TABS Oral Take 100 mg by mouth at bedtime.    . ASPIRIN 325 MG PO TABS Oral Take 325 mg by mouth daily.    Marland Kitchen LISINOPRIL 10 MG PO TABS Oral Take 10 mg by mouth daily.    . ADULT MULTIVITAMIN W/MINERALS CH Oral Take 1 tablet by mouth daily.      BP 187/85  Pulse 76  Temp 98.8 F (37.1 C) (Oral)  Resp 18  SpO2 99%  Physical Exam  Nursing note and vitals reviewed. Constitutional: He is oriented to person, place, and time. He  appears well-developed and well-nourished.  HENT:  Head: Normocephalic and atraumatic.  Cardiovascular: Normal rate and regular rhythm.   Pulmonary/Chest: Effort normal and breath sounds normal.  Abdominal: Soft.  Musculoskeletal: Normal range of motion.  Neurological: He is alert and oriented to person, place, and time. He has normal reflexes.  Skin: Skin is warm.  Psychiatric: He has a normal mood and affect.    ED Course  Procedures (including critical care time)  Labs Reviewed - No data to display No results found.   1. Diabetes mellitus       MDM   rx for metformin, lexapro, trazadone and diabetes supplys       Lonia Skinner [place], [place] 01/18/12 1602  Lonia Skinner [place], [place] 01/18/12 [phone]

## 2012-01-18 NOTE — ED Notes (Signed)
Pt states he is currently staying at [facility], needs refill on trazadone and lexapro.  Additionally, he needs lancets and strips for blood glucose monitor but is unsure which machine he has.  States he has sufficient metformin

## 2012-01-20 NOTE — ED Provider Notes (Signed)
Medical screening examination/treatment/procedure(s) were performed by non-physician practitioner and as supervising physician I was immediately available for consultation/collaboration.  Saralynn Langhorst   Dean Wonder, MD 01/20/12 1046 

## 2012-01-21 ENCOUNTER — Telehealth (HOSPITAL_COMMUNITY): Payer: Self-pay | Admitting: *Deleted

## 2012-01-21 NOTE — ED Notes (Signed)
[facility] pharmacy called on VM about Metformin Rx. I called back and they said it was only for 30 tablets or 15 days and pt. would have to make his co-pay twice in the month.  She said the other Rx.'s were for 1 month. Discussed with [name] PA and she said change it to # 60 with 2 refills.  Pharmacist notified of the change. [name] 01/21/2012

## 2012-02-19 ENCOUNTER — Emergency Department (INDEPENDENT_AMBULATORY_CARE_PROVIDER_SITE_OTHER)
Admission: EM | Admit: 2012-02-19 | Discharge: 2012-02-19 | Disposition: A | Discharge status: Home / Self Care | Payer: Medicare Other | Source: Home / Self Care

## 2012-02-19 ENCOUNTER — Encounter (HOSPITAL_COMMUNITY): Payer: Self-pay | Admitting: Emergency Medicine

## 2012-02-19 DIAGNOSIS — Z760 Encounter for issue of repeat prescription: Secondary | ICD-10-CM

## 2012-02-19 DIAGNOSIS — E119 Type 2 diabetes mellitus without complications: Secondary | ICD-10-CM

## 2012-02-19 MED ORDER — UNABLE TO FIND: Status: DC

## 2012-02-19 NOTE — ED Provider Notes (Signed)
History     CSN: [id number]  Arrival date & time 02/19/12  1728   First MD Initiated Contact with Patient 02/19/12 1734      Chief Complaint  Patient presents with  . 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  Has remained substance abuse free for approximately 4 months.  Patient had recently presented with wanting refills for his medications.  Due to other reasons patient has a new glucose machine and as a result he needs test strips and lancets as a result presented to the urgent care for further evaluation.  Past Medical History  Diagnosis Date  . Diabetes mellitus   . Hypertension   . CAD (coronary artery disease)     Past Surgical History  Procedure Date  . Stint   . Coronary angioplasty with stent placement     History reviewed. No pertinent family history.  History  Substance Use Topics  . Smoking status: Former Smoker    Quit date: 09/24/2011  . Smokeless tobacco: Not on file  . Alcohol Use: No      Review of Systems  Constitutional: Negative.   HENT: Negative.   Eyes: Negative.   Respiratory: Negative.   Cardiovascular: Negative.   Gastrointestinal: Negative.   Musculoskeletal: Negative.   Skin: Negative.   Neurological: Negative.   Hematological: Negative.   Psychiatric/Behavioral: Negative.     Allergies  Review of patient's allergies indicates no known allergies.  Home Medications   Current Outpatient Rx  Name Route Sig Dispense Refill  . LISINOPRIL 10 MG PO TABS Oral Take 10 mg by mouth daily.    Marland Kitchen METFORMIN HCL 500 MG PO TABS Oral Take 1 tablet (500 mg total) by mouth 2 (two) times daily with a meal. 30 tablet 2  . TRAZODONE HCL 100 MG PO TABS Oral Take 1 tablet (100 mg total) by mouth at bedtime. 30 tablet 2  . ASPIRIN 325 MG PO TABS Oral Take 325 mg  by mouth daily.    Marland Kitchen ESCITALOPRAM OXALATE 10 MG PO TABS Oral Take 1 tablet (10 mg total) by mouth daily. 30 tablet 2  . GLUCOSE BLOOD VI STRP  Use as instructed 100 each 12  . GLUCOSE BLOOD VI STRP  Use as instructed 100 each 12  . ADULT MULTIVITAMIN W/MINERALS CH Oral Take 1 tablet by mouth daily.      BP 162/77  Pulse 76  Temp 99.1 F (37.3 C) (Oral)  Resp 16  SpO2 100%  Physical Exam  Nursing note and vitals reviewed. Constitutional: He is oriented to person, place, and time. He appears well-developed and well-nourished.  HENT:  Head: Normocephalic and atraumatic.  Eyes: Pupils are equal, round, and reactive to light.  Neck: Normal range of motion. Neck supple.  Cardiovascular: Normal rate and regular rhythm.   Pulmonary/Chest: Effort normal and breath sounds normal.  Abdominal: Soft.  Musculoskeletal: Normal range of motion.  Neurological: He is alert and oriented to person, place, and time.  Skin: Skin is warm and dry.  Psychiatric: He has a normal mood and affect.    ED Course  Procedures (including critical care time)  Labs Reviewed - No data to display No results found.   1. Medication refill       MDM  Patient's labs reviewed from June of 2013.  Patient given prescription for lancets and test strips.  Patient also provided with  resources for her primary care physician to take care of his health needs.        Cristal Ford, MD 02/19/12 [phone]

## 2012-02-19 NOTE — ED Notes (Addendum)
Pt has history of diabetes and needs refill of medication. Needs rx for accu-check aviva plus strips and lancets, this is what his insurance will cover.

## 2012-03-12 ENCOUNTER — Emergency Department (INDEPENDENT_AMBULATORY_CARE_PROVIDER_SITE_OTHER)
Admission: EM | Admit: 2012-03-12 | Discharge: 2012-03-12 | Disposition: A | Discharge status: Home / Self Care | Payer: Medicare Other | Source: Home / Self Care

## 2012-03-12 ENCOUNTER — Encounter (HOSPITAL_COMMUNITY): Payer: Self-pay | Admitting: *Deleted

## 2012-03-12 DIAGNOSIS — M1710 Unilateral primary osteoarthritis, unspecified knee: Secondary | ICD-10-CM

## 2012-03-12 DIAGNOSIS — M25561 Pain in right knee: Secondary | ICD-10-CM

## 2012-03-12 DIAGNOSIS — M25569 Pain in unspecified knee: Secondary | ICD-10-CM

## 2012-03-12 MED ORDER — MELOXICAM 15 MG PO TABS: 15.0000 mg | ORAL_TABLET | Freq: Every day | ORAL | Status: DC

## 2012-03-12 NOTE — ED Notes (Signed)
Pt   Reports  Pain in  Both   Knees       X  At  Least  6  Months       He  Reports  Has   Had  Problems   In  Past   With  His  Knees              And  Had  Seen  An orthopedist in  [place]      He  Reports  He  Is  On  His  Knees   A  Lot  During  His job  At  [employer]    For  His  Time  At  [employer]

## 2012-03-12 NOTE — ED Provider Notes (Signed)
History     CSN: [id number]  Arrival date & time 03/12/12  1140   None     Chief Complaint  Patient presents with  . Knee Pain    (Consider location/radiation/quality/duration/timing/severity/associated sxs/prior treatment) HPI Comments: 66 year old male resident of a [facility] presents with chronic pain in the bilateral knees. He states he said pain in both knees for approximately 9 months, but says he's been working at the [employer] and having to work on his knees his pain his been worse in the past 4 months. He is able to ambulate with little to no pain however when crouching down and placing his knees on cement or a hard floor his knees are painful. He denies any blunt trauma twisting or torsion injury.   Past Medical History  Diagnosis Date  . Diabetes mellitus   . Hypertension   . CAD (coronary artery disease)     Past Surgical History  Procedure Date  . Stint   . Coronary angioplasty with stent placement     No family history on file.  History  Substance Use Topics  . Smoking status: Former Smoker    Quit date: 09/24/2011  . Smokeless tobacco: Not on file  . Alcohol Use: No      Review of Systems  Constitutional: Positive for activity change. Negative for fever.  Respiratory: Negative.   Gastrointestinal: Negative.   Genitourinary: Negative.   Musculoskeletal: Positive for arthralgias. Negative for back pain and joint swelling.       As per HPI  Skin: Negative.   Neurological: Negative for dizziness, weakness, numbness and headaches.    Allergies  Review of patient's allergies indicates no known allergies.  Home Medications   Current Outpatient Rx  Name Route Sig Dispense Refill  . ASPIRIN 325 MG PO TABS Oral Take 325 mg by mouth daily.    Marland Kitchen ESCITALOPRAM OXALATE 10 MG PO TABS Oral Take 1 tablet (10 mg total) by mouth daily. 30 tablet 2  . GLUCOSE BLOOD VI STRP  Use as instructed 100 each 12  . GLUCOSE BLOOD VI STRP  Use as instructed  100 each 12  . LISINOPRIL 10 MG PO TABS Oral Take 10 mg by mouth daily.    . MELOXICAM 15 MG PO TABS Oral Take 1 tablet (15 mg total) by mouth daily. 14 tablet 0  . METFORMIN HCL 500 MG PO TABS Oral Take 1 tablet (500 mg total) by mouth 2 (two) times daily with a meal. 30 tablet 2  . ADULT MULTIVITAMIN W/MINERALS CH Oral Take 1 tablet by mouth daily.    . TRAZODONE HCL 100 MG PO TABS Oral Take 1 tablet (100 mg total) by mouth at bedtime. 30 tablet 2  . UNABLE TO FIND  60 lancets and test strips for glucose meter and management of diabetes with 1 refill. 60 Units 1    BP 169/99  Pulse 60  Temp 98.3 F (36.8 C) (Oral)  Resp 18  SpO2 100%  Physical Exam  Constitutional: He is oriented to person, place, and time. He appears well-developed and well-nourished.  HENT:  Head: Normocephalic and atraumatic.  Eyes: EOM are normal. Left eye exhibits no discharge.  Neck: Normal range of motion. Neck supple.  Musculoskeletal: Normal range of motion. He exhibits no edema.       Examination of both knees reveal no evidence of intra-articular effusion or edema extra-articular. No bony points of tenderness. Knee with full range of motion, extension to 180, flexion  beyond the 110. No open skin areas or other lesions. No pretibial edema or tenderness.  Neurological: He is alert and oriented to person, place, and time. No cranial nerve deficit.  Skin: Skin is warm and dry. No erythema.  Psychiatric: He has a normal mood and affect.    ED Course  Procedures (including critical care time)  Labs Reviewed - No data to display No results found.   1. Osteoarthritis, knee   2. Knee pain, bilateral       MDM  He continues to have mild maybe 66 years old and doesn't believe he should be working as he is. 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 By working on his knees this causes moderate pain to the anterior aspect of the knees. I strongly  recommended he obtain workers kneepads to wear. This will allow him to have soft coushon on his knees and relieved much of the pain. In regards to make an incision as to whether he should be working or not he should be decided by personnel at the residence in which he lives. Or, he should see a primary care provider for this decision and possible paperwork.  He is also given a prescription for Mobic 15 mg to take one time a day for pain and inflammation.        Hayden Rasmussen, NP 03/12/12 1338

## 2012-03-13 NOTE — ED Provider Notes (Signed)
Medical screening examination/treatment/procedure(s) were performed by non-physician practitioner and as supervising physician I was immediately available for consultation/collaboration.  Leslee Home, M.D.   Reuben Likes, MD 03/13/12 [phone]

## 2012-03-27 ENCOUNTER — Encounter (HOSPITAL_COMMUNITY): Payer: Self-pay | Admitting: Emergency Medicine

## 2012-03-27 ENCOUNTER — Emergency Department (INDEPENDENT_AMBULATORY_CARE_PROVIDER_SITE_OTHER)
Admission: EM | Admit: 2012-03-27 | Discharge: 2012-03-27 | Disposition: A | Discharge status: Home / Self Care | Payer: Medicare Other | Source: Home / Self Care | Attending: Emergency Medicine | Admitting: Emergency Medicine

## 2012-03-27 DIAGNOSIS — L0390 Cellulitis, unspecified: Secondary | ICD-10-CM

## 2012-03-27 DIAGNOSIS — B353 Tinea pedis: Secondary | ICD-10-CM

## 2012-03-27 MED ORDER — CEFTRIAXONE SODIUM 1 G IJ SOLR
1.0000 g | Freq: Once | INTRAMUSCULAR | Status: AC
  Administered 2012-03-27: 1 g via INTRAMUSCULAR

## 2012-03-27 MED ORDER — TERBINAFINE HCL 1 % EX CREA: TOPICAL_CREAM | Freq: Two times a day (BID) | CUTANEOUS | Status: DC

## 2012-03-27 MED ORDER — LIDOCAINE HCL (PF) 1 % IJ SOLN
INTRAMUSCULAR | Status: AC
  Filled 2012-03-27: qty 5

## 2012-03-27 MED ORDER — ALUM SULFATE-CA ACETATE EX PACK: PACK | CUTANEOUS | Status: DC

## 2012-03-27 MED ORDER — CEFTRIAXONE SODIUM 1 G IJ SOLR
INTRAMUSCULAR | Status: AC
  Filled 2012-03-27: qty 10

## 2012-03-27 MED ORDER — AMOXICILLIN-POT CLAVULANATE 875-125 MG PO TABS: 1.0000 | ORAL_TABLET | Freq: Two times a day (BID) | ORAL | Status: DC

## 2012-03-27 NOTE — ED Notes (Addendum)
Pt states that over the past couple of days he has had itching and soreness of his left foot.  Pt has recently been wearing insulated socks and using gold bond powder.

## 2012-03-27 NOTE — ED Provider Notes (Signed)
Chief Complaint  Patient presents with  . Recurrent Skin Infections    infection of left foot. hx diabetes.    History of Present Illness:   The patient is a 66 year old male who is a resident of [name]. He complains of an itchy rash on his left foot for the past 2 days. It's a little bit painful and is been draining some fluid. This began after he wore a pair of, thick, cold weather socks. He developed itching in the interdigital areas followed by cracking and then the pain and the drainage. He denies any swelling of the dorsum of the foot, fever or chills. He is a diabetic and has high blood pressure. He controls his diabetes with oral medications. He denies any numbness or tingling in his foot.  Review of Systems:  Other than noted above, the patient denies any of the following symptoms: Systemic:  No fever, chills, sweats, weight loss, or fatigue. ENT:  No nasal congestion, rhinorrhea, sore throat, swelling of lips, tongue or throat. Resp:  No cough, wheezing, or shortness of breath. Skin:  No rash, itching, nodules, or suspicious lesions.  PMFSH:  Past medical history, family history, social history, meds, and allergies were reviewed.  Physical Exam:   Vital signs:  BP 148/73  Pulse 72  Temp 98.8 F (37.1 C) (Oral)  Resp 20  SpO2 100% Gen:  Alert, oriented, in no distress. ENT:  Pharynx clear, no intraoral lesions, moist mucous membranes. Lungs:  Clear to auscultation. Skin:  Exam of the foot reveals cracking in the interdigital webs between all the toes. There was no exudate or drainage, but the feet had a very foul smell. No swelling. No swelling over the dorsum of the foot. Pulses were full. He has good capillary refill.  Course in Urgent Care Center:   He was given Rocephin 1 g IM and tolerated this well without any immediate side effects.  Assessment:  The primary encounter diagnosis was Tinea pedis. A diagnosis of Cellulitis was also pertinent to this visit.  I think  this is tinea pedis with possibly some secondary bacterial infection as well.  Plan:   1.  The following meds were prescribed:   New Prescriptions   ALUMINUM SULFATE-CALCIUM ACETATE (DOMEBORO) PACKET    Dissolve 8 packets in 1 gallon of water and soak feet for 5 minutes twice daily   AMOXICILLIN-CLAVULANATE (AUGMENTIN) 875-125 MG PER TABLET    Take 1 tablet by mouth 2 (two) times daily.   TERBINAFINE (LAMISIL) 1 % CREAM    Apply topically 2 (two) times daily.   2.  The patient was instructed in symptomatic care and handouts were given. 3.  The patient was told to return if becoming worse in any way, in 2 days for followup, and given some red flag symptoms that would indicate earlier return.     Reuben Likes, MD 03/27/12 [phone]

## 2012-03-29 ENCOUNTER — Encounter (HOSPITAL_COMMUNITY): Payer: Self-pay | Admitting: Emergency Medicine

## 2012-03-29 ENCOUNTER — Emergency Department (INDEPENDENT_AMBULATORY_CARE_PROVIDER_SITE_OTHER)
Admission: EM | Admit: 2012-03-29 | Discharge: 2012-03-29 | Disposition: A | Discharge status: Home / Self Care | Payer: Medicare Other | Source: Home / Self Care | Attending: Family Medicine | Admitting: Family Medicine

## 2012-03-29 DIAGNOSIS — B353 Tinea pedis: Secondary | ICD-10-CM

## 2012-03-29 DIAGNOSIS — L0390 Cellulitis, unspecified: Secondary | ICD-10-CM

## 2012-03-29 NOTE — ED Provider Notes (Signed)
History     CSN: [id number]  Arrival date & time 03/29/12  1150   First MD Initiated Contact with Patient 03/29/12 1152      Chief Complaint  Patient presents with  . Follow-up    (Consider location/radiation/quality/duration/timing/severity/associated sxs/prior treatment) Patient is a 66 y.o. male presenting with rash. The history is provided by the patient.  Rash  This is a new problem. The current episode started more than 2 days ago (foot rash is signif improving with treatment advised). The problem has been rapidly improving. There has been no fever.    Past Medical History  Diagnosis Date  . Diabetes mellitus   . Hypertension   . CAD (coronary artery disease)     Past Surgical History  Procedure Date  . Stint   . Coronary angioplasty with stent placement     No family history on file.  History  Substance Use Topics  . Smoking status: Former Smoker    Quit date: 09/24/2011  . Smokeless tobacco: Not on file  . Alcohol Use: No      Review of Systems  Constitutional: Negative.   Skin: Positive for rash.    Allergies  Review of patient's allergies indicates no known allergies.  Home Medications   Current Outpatient Rx  Name Route Sig Dispense Refill  . ALUM SULFATE-CA ACETATE EX PACK  Dissolve 8 packets in 1 gallon of water and soak feet for 5 minutes twice daily 12 each 12  . AMOXICILLIN-POT CLAVULANATE 875-125 MG PO TABS Oral Take 1 tablet by mouth 2 (two) times daily. 20 tablet 0  . ASPIRIN 325 MG PO TABS Oral Take 325 mg by mouth daily.    Marland Kitchen ESCITALOPRAM OXALATE 10 MG PO TABS Oral Take 1 tablet (10 mg total) by mouth daily. 30 tablet 2  . GLUCOSE BLOOD VI STRP  Use as instructed 100 each 12  . GLUCOSE BLOOD VI STRP  Use as instructed 100 each 12  . LISINOPRIL 10 MG PO TABS Oral Take 10 mg by mouth daily.    . MELOXICAM 15 MG PO TABS Oral Take 1 tablet (15 mg total) by mouth daily. 14 tablet 0  . METFORMIN HCL 500 MG PO TABS Oral Take 1 tablet (500  mg total) by mouth 2 (two) times daily with a meal. 30 tablet 2  . ADULT MULTIVITAMIN W/MINERALS CH Oral Take 1 tablet by mouth daily.    . TERBINAFINE HCL 1 % EX CREA Topical Apply topically 2 (two) times daily. 30 g 0  . TRAZODONE HCL 100 MG PO TABS Oral Take 1 tablet (100 mg total) by mouth at bedtime. 30 tablet 2  . UNABLE TO FIND  60 lancets and test strips for glucose meter and management of diabetes with 1 refill. 60 Units 1    BP 167/77  Pulse 72  Temp 98.7 F (37.1 C) (Oral)  Resp 18  SpO2 100%  Physical Exam  Nursing note and vitals reviewed. Constitutional: He appears well-developed and well-nourished.  Skin: Skin is warm and dry. No rash noted.       Foot rash has cleared, no dryness or cracking or odor. No itching, nontender.    ED Course  Procedures (including critical care time)  Labs Reviewed - No data to display No results found.   1. Cellulitis       MDM          Linna Hoff, MD 03/29/12 1325

## 2012-03-29 NOTE — ED Notes (Signed)
Pt here for a f/u... Was seen on 03/27/12 and treated for tinea pedis/cellulitis... Pt states sx are improving... Is alert w/no signs of distress.

## 2012-04-08 ENCOUNTER — Encounter (HOSPITAL_COMMUNITY): Payer: Self-pay | Admitting: *Deleted

## 2012-04-08 ENCOUNTER — Emergency Department (HOSPITAL_COMMUNITY)
Admission: EM | Admit: 2012-04-08 | Discharge: 2012-04-08 | Disposition: A | Discharge status: Home / Self Care | Payer: Medicare Other | Source: Home / Self Care

## 2012-04-08 DIAGNOSIS — L089 Local infection of the skin and subcutaneous tissue, unspecified: Secondary | ICD-10-CM

## 2012-04-08 MED ORDER — AMOXICILLIN-POT CLAVULANATE 875-125 MG PO TABS: 1.0000 | ORAL_TABLET | Freq: Two times a day (BID) | ORAL | Status: DC

## 2012-04-08 MED ORDER — TERBINAFINE HCL 1 % EX CREA: TOPICAL_CREAM | Freq: Two times a day (BID) | CUTANEOUS | Status: DC

## 2012-04-08 NOTE — ED Notes (Signed)
Pt. Is at [facility] and states he would have to go back to work tomorrow.  States he had 2 days off the last time to start treatment. Discussed with [name] PA and she approved 2 days off. Note done as directed and given to pt.

## 2012-04-08 NOTE — ED Notes (Signed)
Pt. here for the same thing he was seen for on 10/31 Tinea pedis/cellulitis onset 3 days ago.  Started on top of foot between his toes.  Looks like blisters and is raw. 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

## 2012-04-08 NOTE — ED Provider Notes (Signed)
Medical screening examination/treatment/procedure(s) were performed by non-physician practitioner and as supervising physician I was immediately available for consultation/collaboration.  Raynald Blend, MD 04/08/12 1756

## 2012-04-08 NOTE — ED Provider Notes (Signed)
History     CSN: [id number]  Arrival date & time 04/08/12  1548   None     Chief Complaint  Patient presents with  . Cellulitis    (Consider location/radiation/quality/duration/timing/severity/associated sxs/prior treatment) Patient is a 66 y.o. male presenting with lower extremity pain. The history is provided by the patient. No language interpreter was used.  Foot Pain This is a new problem. The problem occurs constantly. The symptoms are aggravated by walking. Nothing relieves the symptoms.  Pt reports he was seen here 2 weeks ago for a foot infection.  Pt reports infection has returned  Past Medical History  Diagnosis Date  . Diabetes mellitus   . Hypertension   . CAD (coronary artery disease)     Past Surgical History  Procedure Date  . Stint   . Coronary angioplasty with stent placement   . Mouth surgery     cut bone out of upper and lower jaw to prepare for dentures    History reviewed. No pertinent family history.  History  Substance Use Topics  . Smoking status: Former Smoker    Quit date: 09/24/2011  . Smokeless tobacco: Not on file  . Alcohol Use: No      Review of Systems  Musculoskeletal: Positive for myalgias and joint swelling.  Skin: Positive for rash.  All other systems reviewed and are negative.    Allergies  Review of patient's allergies indicates no known allergies.  Home Medications   Current Outpatient Rx  Name  Route  Sig  Dispense  Refill  . ASPIRIN 325 MG PO TABS   Oral   Take 325 mg by mouth daily.         Marland Kitchen ESCITALOPRAM OXALATE 10 MG PO TABS   Oral   Take 1 tablet (10 mg total) by mouth daily.   30 tablet   2   . LISINOPRIL 10 MG PO TABS   Oral   Take 10 mg by mouth daily.         . MELOXICAM 15 MG PO TABS   Oral   Take 1 tablet (15 mg total) by mouth daily.   14 tablet   0   . METFORMIN HCL 500 MG PO TABS   Oral   Take 1 tablet (500 mg total) by mouth 2 (two) times daily with a meal.   30 tablet    2   . ADULT MULTIVITAMIN W/MINERALS CH   Oral   Take 1 tablet by mouth daily.         . TERBINAFINE HCL 1 % EX CREA   Topical   Apply topically 2 (two) times daily.   30 g   0   . TRAZODONE HCL 100 MG PO TABS   Oral   Take 1 tablet (100 mg total) by mouth at bedtime.   30 tablet   2   . ALUM SULFATE-CA ACETATE EX PACK      Dissolve 8 packets in 1 gallon of water and soak feet for 5 minutes twice daily   12 each   12   . AMOXICILLIN-POT CLAVULANATE 875-125 MG PO TABS   Oral   Take 1 tablet by mouth 2 (two) times daily.   20 tablet   0   . GLUCOSE BLOOD VI STRP      Use as instructed   100 each   12   . GLUCOSE BLOOD VI STRP      Use as instructed   100 each  12   . UNABLE TO FIND      60 lancets and test strips for glucose meter and management of diabetes with 1 refill.   60 Units   1     BP 187/89  Pulse 74  Temp 99.1 F (37.3 C) (Oral)  Resp 20  SpO2 99%  Physical Exam  Nursing note and vitals reviewed. Constitutional: He is oriented to person, place, and time. He appears well-developed and well-nourished.  HENT:  Head: Normocephalic and atraumatic.  Musculoskeletal: Normal range of motion.  Neurological: He is alert and oriented to person, place, and time. He has normal reflexes.  Skin: Skin is warm.  Psychiatric: He has a normal mood and affect.    ED Course  Procedures (including critical care time)  Labs Reviewed - No data to display No results found.   1. Foot infection       MDM  Pt given augmentin and lamisea        Lonia Skinner [place], [place] 04/08/12 1734  Lonia Skinner [place], [place] 04/08/12 1740

## 2012-05-30 ENCOUNTER — Encounter (HOSPITAL_COMMUNITY): Payer: Self-pay

## 2012-05-30 ENCOUNTER — Emergency Department (HOSPITAL_COMMUNITY): Payer: Medicare Other

## 2012-05-30 ENCOUNTER — Emergency Department (HOSPITAL_COMMUNITY)
Admission: EM | Admit: 2012-05-30 | Discharge: 2012-05-30 | Disposition: A | Discharge status: Home / Self Care | Payer: Medicare Other | Attending: Emergency Medicine | Admitting: Emergency Medicine

## 2012-05-30 DIAGNOSIS — E119 Type 2 diabetes mellitus without complications: Secondary | ICD-10-CM | POA: Insufficient documentation

## 2012-05-30 DIAGNOSIS — I10 Essential (primary) hypertension: Secondary | ICD-10-CM | POA: Insufficient documentation

## 2012-05-30 DIAGNOSIS — Z87891 Personal history of nicotine dependence: Secondary | ICD-10-CM | POA: Insufficient documentation

## 2012-05-30 DIAGNOSIS — Z79899 Other long term (current) drug therapy: Secondary | ICD-10-CM | POA: Insufficient documentation

## 2012-05-30 DIAGNOSIS — Z7982 Long term (current) use of aspirin: Secondary | ICD-10-CM | POA: Insufficient documentation

## 2012-05-30 DIAGNOSIS — R490 Dysphonia: Secondary | ICD-10-CM

## 2012-05-30 DIAGNOSIS — I2510 Atherosclerotic heart disease of native coronary artery without angina pectoris: Secondary | ICD-10-CM | POA: Insufficient documentation

## 2012-05-30 NOTE — ED Provider Notes (Signed)
History     CSN: [id number]  Arrival date & time 05/30/12  0750   First MD Initiated Contact with Patient 05/30/12 [phone]      Chief Complaint  Patient presents with  . Sore Throat    (Consider location/radiation/quality/duration/timing/severity/associated sxs/prior treatment) HPI Comments: 67 year old male presents to the emergency department complaining of a hoarse voice worsening over the past 3 weeks. He has tried throat lozenges along with cough medicine without any change. Denies sore throat, difficulty swallowing, fever, chills or any other symptoms. He has smoked for the past 50 years up until about 8 months ago since living at the [facility] house. Patient is from [place] and is here at the [facility] house for one more month.  The history is provided by the patient.    Past Medical History  Diagnosis Date  . Diabetes mellitus   . Hypertension   . CAD (coronary artery disease)     Past Surgical History  Procedure Date  . Stint   . Coronary angioplasty with stent placement   . Mouth surgery     cut bone out of upper and lower jaw to prepare for dentures    History reviewed. No pertinent family history.  History  Substance Use Topics  . Smoking status: Former Smoker    Quit date: 09/24/2011  . Smokeless tobacco: Not on file  . Alcohol Use: No      Review of Systems  Constitutional: Negative for fever and chills.  HENT: Positive for voice change. Negative for sore throat, drooling, trouble swallowing, neck pain and neck stiffness.   All other systems reviewed and are negative.    Allergies  Review of patient's allergies indicates no known allergies.  Home Medications   Current Outpatient Rx  Name  Route  Sig  Dispense  Refill  . ALUM SULFATE-CA ACETATE EX PACK      Dissolve 8 packets in 1 gallon of water and soak feet for 5 minutes twice daily   12 each   12   . AMOXICILLIN-POT CLAVULANATE 875-125 MG PO TABS   Oral   Take 1 tablet by mouth 2  (two) times daily.   20 tablet   0   . ASPIRIN 325 MG PO TABS   Oral   Take 325 mg by mouth daily.         Marland Kitchen ESCITALOPRAM OXALATE 10 MG PO TABS   Oral   Take 1 tablet (10 mg total) by mouth daily.   30 tablet   2   . GLUCOSE BLOOD VI STRP      Use as instructed   100 each   12   . GLUCOSE BLOOD VI STRP      Use as instructed   100 each   12   . LISINOPRIL 10 MG PO TABS   Oral   Take 10 mg by mouth daily.         . MELOXICAM 15 MG PO TABS   Oral   Take 1 tablet (15 mg total) by mouth daily.   14 tablet   0   . METFORMIN HCL 500 MG PO TABS   Oral   Take 1 tablet (500 mg total) by mouth 2 (two) times daily with a meal.   30 tablet   2   . ADULT MULTIVITAMIN W/MINERALS CH   Oral   Take 1 tablet by mouth daily.         . TERBINAFINE HCL 1 % EX CREA  Topical   Apply topically 2 (two) times daily.   30 g   3   . TRAZODONE HCL 100 MG PO TABS   Oral   Take 1 tablet (100 mg total) by mouth at bedtime.   30 tablet   2   . UNABLE TO FIND      60 lancets and test strips for glucose meter and management of diabetes with 1 refill.   60 Units   1     BP 197/77  Pulse 84  Temp 97.8 F (36.6 C) (Oral)  Resp 18  SpO2 96%  Physical Exam  Nursing note and vitals reviewed. Constitutional: He is oriented to person, place, and time. He appears well-developed and well-nourished. No distress.  HENT:  Head: Normocephalic and atraumatic.  Nose: Nose normal.  Mouth/Throat: Mucous membranes are normal. Posterior oropharyngeal erythema present. No oropharyngeal exudate or posterior oropharyngeal edema.  Neck: Normal range of motion. Neck supple. No tracheal tenderness present. No tracheal deviation and normal range of motion present.       Hoarse voice.  Cardiovascular: Normal rate, regular rhythm and normal heart sounds.   Pulmonary/Chest: Effort normal and breath sounds normal. He has no wheezes.  Musculoskeletal: Normal range of motion. He exhibits no  edema.  Neurological: He is alert and oriented to person, place, and time.  Skin: Skin is warm and dry.  Psychiatric: He has a normal mood and affect. His behavior is normal.    ED Course  Procedures (including critical care time)  Labs Reviewed - No data to display Dg Neck Soft Tissue  05/30/2012  *RADIOLOGY REPORT*  Clinical Data: Hoarse voice, worse for 3 weeks, worsening, history of oral surgery (02/26/2012).  NECK SOFT TISSUES - 1+ VIEW  Comparison: None.  Findings:  Normal appearance of the prevertebral soft tissues.  Normal appearance of the epiglottis and aryepiglottic folds. No definite adenoidal hypertrophy.  Tracheal air column is midline.  Calcifications overlie the central location of the right carotid bulb.  No radiopaque foreign body.  No focal airspace opacities within the visualized lung apices.  C1 to the superior endplate of T2 is visualized on the lateral radiograph.  Normal alignment of the cervical spine.  No definite anterolisthesis.  Cervical vertebral body heights are preserved.  There is mild multilevel cervical spine DDD, worse at C5 - C6 and C6 - C7 with disc space height loss, end plate irregularity and anteriorly directed osteophytosis.  IMPRESSION: 1.  Normal appearance of the prevertebral soft tissues. 2.  Calcifications overlie the expected location of the right carotid bulb.  Further evaluation with carotid Doppler ultrasound may be performed as clinically indicated. 3.  Mild multilevel cervical spine DDD.   Original Report Authenticated By: Tacey Ruiz, MD      1. Voice hoarseness       MDM  67 year old male with a hoarse voice worsening for 3 weeks. 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 Plain film of neck ordered after discussing case with Dr. Lorenso Courier which not have any significant acute findings. I spoke with Dr. Jearld Fenton with ENT who will see patient next week in his clinic for followup. Patient was also given an ENT specialist  name where he lives in [place] via Dr. Lorenso Courier. Close return precautions discussed in detail. Patient states his understanding of plan and is agreeable.        Trevor Mace, PA-C 05/30/12 [phone]

## 2012-05-30 NOTE — ED Notes (Signed)
Patient transported to X-ray 

## 2012-05-30 NOTE — ED Notes (Signed)
Pt reports sore throat for approx 3 weeks

## 2012-05-30 NOTE — ED Provider Notes (Signed)
Medical screening examination/treatment/procedure(s) were conducted as a shared visit with non-physician practitioner(s) and myself.  I personally evaluated the patient during the encounter.  Pt evaluated at the bedside. He has no evidence of airway compromise.  Directed non emergent outpt follow-up with either ENT here in [place] or in his hometown [place].  Tobin Chad, MD 05/30/12 1718

## 2012-05-30 NOTE — ED Notes (Signed)
EDP and PA at bedside.  

## 2012-06-18 ENCOUNTER — Encounter (HOSPITAL_COMMUNITY): Payer: Self-pay | Admitting: *Deleted

## 2012-06-18 ENCOUNTER — Emergency Department (HOSPITAL_COMMUNITY): Payer: Medicare Other

## 2012-06-18 ENCOUNTER — Inpatient Hospital Stay (HOSPITAL_COMMUNITY)
Admission: EM | Admit: 2012-06-18 | Discharge: 2012-06-20 | DRG: 638 | Disposition: A | Discharge status: Home / Self Care | Payer: Medicare Other | Attending: Internal Medicine | Admitting: Internal Medicine

## 2012-06-18 ENCOUNTER — Emergency Department (INDEPENDENT_AMBULATORY_CARE_PROVIDER_SITE_OTHER)
Admission: EM | Admit: 2012-06-18 | Discharge: 2012-06-18 | Disposition: A | Discharge status: Nursing Home (Includes ICF) | Payer: Medicare Other | Source: Home / Self Care | Attending: Family Medicine | Admitting: Family Medicine

## 2012-06-18 DIAGNOSIS — Z8719 Personal history of other diseases of the digestive system: Secondary | ICD-10-CM

## 2012-06-18 DIAGNOSIS — B353 Tinea pedis: Secondary | ICD-10-CM

## 2012-06-18 DIAGNOSIS — IMO0002 Reserved for concepts with insufficient information to code with codable children: Secondary | ICD-10-CM

## 2012-06-18 DIAGNOSIS — N179 Acute kidney failure, unspecified: Secondary | ICD-10-CM

## 2012-06-18 DIAGNOSIS — R739 Hyperglycemia, unspecified: Secondary | ICD-10-CM

## 2012-06-18 DIAGNOSIS — I10 Essential (primary) hypertension: Secondary | ICD-10-CM

## 2012-06-18 DIAGNOSIS — Z91199 Patient's noncompliance with other medical treatment and regimen due to unspecified reason: Secondary | ICD-10-CM

## 2012-06-18 DIAGNOSIS — E875 Hyperkalemia: Secondary | ICD-10-CM | POA: Diagnosis present

## 2012-06-18 DIAGNOSIS — Z7982 Long term (current) use of aspirin: Secondary | ICD-10-CM

## 2012-06-18 DIAGNOSIS — Z87891 Personal history of nicotine dependence: Secondary | ICD-10-CM

## 2012-06-18 DIAGNOSIS — IMO0001 Reserved for inherently not codable concepts without codable children: Secondary | ICD-10-CM

## 2012-06-18 DIAGNOSIS — E1100 Type 2 diabetes mellitus with hyperosmolarity without nonketotic hyperglycemic-hyperosmolar coma (NKHHC): Principal | ICD-10-CM

## 2012-06-18 DIAGNOSIS — Z9861 Coronary angioplasty status: Secondary | ICD-10-CM

## 2012-06-18 DIAGNOSIS — E1101 Type 2 diabetes mellitus with hyperosmolarity with coma: Secondary | ICD-10-CM

## 2012-06-18 DIAGNOSIS — R7309 Other abnormal glucose: Secondary | ICD-10-CM

## 2012-06-18 DIAGNOSIS — Z79899 Other long term (current) drug therapy: Secondary | ICD-10-CM

## 2012-06-18 DIAGNOSIS — Z9119 Patient's noncompliance with other medical treatment and regimen: Secondary | ICD-10-CM

## 2012-06-18 DIAGNOSIS — I2510 Atherosclerotic heart disease of native coronary artery without angina pectoris: Secondary | ICD-10-CM | POA: Diagnosis present

## 2012-06-18 DIAGNOSIS — E1165 Type 2 diabetes mellitus with hyperglycemia: Secondary | ICD-10-CM

## 2012-06-18 HISTORY — DX: Diverticulosis of intestine, part unspecified, without perforation or abscess without bleeding: K57.90

## 2012-06-18 LAB — POCT I-STAT, CHEM 8
BUN: 31 mg/dL — ABNORMAL HIGH (ref 6–23)
Calcium, Ion: 1.21 mmol/L (ref 1.13–1.30)
Chloride: 98 mEq/L (ref 96–112)
Potassium: 5.3 mEq/L — ABNORMAL HIGH (ref 3.5–5.1)
Sodium: 129 mEq/L — ABNORMAL LOW (ref 135–145)

## 2012-06-18 LAB — GLUCOSE, CAPILLARY
Glucose-Capillary: 366 mg/dL — ABNORMAL HIGH (ref 70–99)
Glucose-Capillary: 290 mg/dL — ABNORMAL HIGH (ref 70–99)

## 2012-06-18 LAB — CBC
HCT: 43.9 % (ref 39.0–52.0)
Hemoglobin: 16.3 g/dL (ref 13.0–17.0)
RBC: 5.1 MIL/uL (ref 4.22–5.81)
WBC: 6.9 10*3/uL (ref 4.0–10.5)

## 2012-06-18 LAB — BASIC METABOLIC PANEL
CO2: 23 mEq/L (ref 19–32)
Glucose, Bld: 639 mg/dL (ref 70–99)
Potassium: 5.3 mEq/L — ABNORMAL HIGH (ref 3.5–5.1)
Sodium: 127 mEq/L — ABNORMAL LOW (ref 135–145)

## 2012-06-18 LAB — URINALYSIS, ROUTINE W REFLEX MICROSCOPIC
Glucose, UA: >1000 mg/dL — AB
Hgb urine dipstick: NEGATIVE
Leukocytes, UA: NEGATIVE
Specific Gravity, Urine: 1.036 — ABNORMAL HIGH (ref 1.005–1.030)
pH: 5.5 (ref 5.0–8.0)

## 2012-06-18 MED ORDER — ACETAMINOPHEN 650 MG RE SUPP: 650.0000 mg | Freq: Four times a day (QID) | RECTAL | Status: DC | PRN

## 2012-06-18 MED ORDER — DEXTROSE-NACL 5-0.45 % IV SOLN: INTRAVENOUS | Status: DC

## 2012-06-18 MED ORDER — ASPIRIN 325 MG PO TABS
325.0000 mg | ORAL_TABLET | Freq: Every day | ORAL | Status: DC
  Administered 2012-06-19 – 2012-06-20 (×2): 325 mg via ORAL
  Filled 2012-06-18 (×2): qty 1

## 2012-06-18 MED ORDER — SODIUM CHLORIDE 0.9 % IV SOLN
INTRAVENOUS | Status: DC
  Administered 2012-06-18: 3.1 [IU]/h via INTRAVENOUS
  Administered 2012-06-18: 3.8 [IU]/h via INTRAVENOUS
  Filled 2012-06-18: qty 1

## 2012-06-18 MED ORDER — SODIUM CHLORIDE 0.9 % IV SOLN: INTRAVENOUS | Status: DC

## 2012-06-18 MED ORDER — SODIUM CHLORIDE 0.9 % IV SOLN
INTRAVENOUS | Status: DC
  Administered 2012-06-18 – 2012-06-20 (×4): via INTRAVENOUS

## 2012-06-18 MED ORDER — INSULIN REGULAR BOLUS VIA INFUSION
0.0000 [IU] | Freq: Three times a day (TID) | INTRAVENOUS | Status: DC
  Filled 2012-06-18: qty 10

## 2012-06-18 MED ORDER — DEXTROSE 50 % IV SOLN: 25.0000 mL | INTRAVENOUS | Status: DC | PRN

## 2012-06-18 MED ORDER — ONDANSETRON HCL 4 MG/2ML IJ SOLN: 4.0000 mg | Freq: Three times a day (TID) | INTRAMUSCULAR | Status: DC | PRN

## 2012-06-18 MED ORDER — SODIUM CHLORIDE 0.9 % IV BOLUS (SEPSIS)
1000.0000 mL | Freq: Once | INTRAVENOUS | Status: AC
  Administered 2012-06-18: 1000 mL via INTRAVENOUS

## 2012-06-18 MED ORDER — SODIUM CHLORIDE 0.9 % IV SOLN
INTRAVENOUS | Status: DC
  Administered 2012-06-18: 18:00:00 via INTRAVENOUS

## 2012-06-18 MED ORDER — DEXTROSE-NACL 5-0.45 % IV SOLN
INTRAVENOUS | Status: DC
  Administered 2012-06-18: 23:00:00 via INTRAVENOUS

## 2012-06-18 MED ORDER — ALUM & MAG HYDROXIDE-SIMETH 200-200-20 MG/5ML PO SUSP: 30.0000 mL | Freq: Four times a day (QID) | ORAL | Status: DC | PRN

## 2012-06-18 MED ORDER — TRAZODONE HCL 100 MG PO TABS
100.0000 mg | ORAL_TABLET | Freq: Every day | ORAL | Status: DC
  Administered 2012-06-18 – 2012-06-19 (×2): 100 mg via ORAL
  Filled 2012-06-18 (×3): qty 1

## 2012-06-18 MED ORDER — ONDANSETRON HCL 4 MG/2ML IJ SOLN: 4.0000 mg | Freq: Four times a day (QID) | INTRAMUSCULAR | Status: DC | PRN

## 2012-06-18 MED ORDER — ACETAMINOPHEN 325 MG PO TABS: 650.0000 mg | ORAL_TABLET | Freq: Four times a day (QID) | ORAL | Status: DC | PRN

## 2012-06-18 MED ORDER — ESCITALOPRAM OXALATE 10 MG PO TABS
10.0000 mg | ORAL_TABLET | Freq: Every day | ORAL | Status: DC
  Administered 2012-06-19 – 2012-06-20 (×2): 10 mg via ORAL
  Filled 2012-06-18 (×2): qty 1

## 2012-06-18 MED ORDER — ONDANSETRON HCL 4 MG PO TABS: 4.0000 mg | ORAL_TABLET | Freq: Four times a day (QID) | ORAL | Status: DC | PRN

## 2012-06-18 NOTE — H&P (Signed)
History and Physical  Nicholas Farrell YQM:[id number] DOB: 24-May-1946 DOA: 06/18/2012  Referring physician: Samuel Jester, DO PCP: Default, Provider, MD primary care physician in [place].  Chief Complaint: High blood sugar  HPI:  67 year old man with history of diabetes mellitus presented to urgent care today requesting refill on trazodone and blood sugar check. Blood sugar greater than 600 and referred to the emergency department where he was found to have acute renal failure and severe hyperglycemia.  Patient reports noncompliance with metformin secondary to "negligence". He has had some polyuria and polydipsia but overall is felt well lately. He has noticed high blood sugars for a week.  In the emergency department noted to be afebrile with stable vital signs. Glucose 695, acute renal failure with creatinine 1.7. Chest x-ray negative. EKG sinus rhythm with no acute changes. Given IV fluids and started on IV insulin.  Review of Systems:  Negative for fever, visual changes, sore throat, rash, new muscle aches, chest pain, SOB, dysuria, bleeding, n/v/abdominal pain.  Past Medical History  Diagnosis Date  . Diabetes mellitus   . Hypertension   . CAD (coronary artery disease)   . Diverticulosis     Past Surgical History  Procedure Date  . Coronary angioplasty with stent placement   . Mouth surgery     cut bone out of upper and lower jaw to prepare for dentures    Social History:  reports that he quit smoking about 8 months ago. He does not have any smokeless tobacco history on file. He reports that he does not drink alcohol or use illicit drugs.  No Known Allergies  History reviewed. No pertinent family history. Patient is not aware of any particular medical problems in the family.   Prior to Admission medications   Medication Sig Start Date End Date Taking? Authorizing Provider  aspirin 325 MG tablet Take 325 mg by mouth daily.   Yes Historical Provider, MD  escitalopram  (LEXAPRO) 10 MG tablet Take 1 tablet (10 mg total) by mouth daily. 01/18/12  Yes Elson Areas, PA  fish oil-omega-3 fatty acids 1000 MG capsule Take 2 g by mouth daily.   Yes Historical Provider, MD  lisinopril (PRINIVIL,ZESTRIL) 20 MG tablet Take 20 mg by mouth daily.   Yes Historical Provider, MD  metFORMIN (GLUCOPHAGE) 500 MG tablet Take 1 tablet (500 mg total) by mouth 2 (two) times daily with a meal. 01/18/12  Yes Elson Areas, PA  traZODone (DESYREL) 100 MG tablet Take 1 tablet (100 mg total) by mouth at bedtime. 01/18/12  Yes Elson Areas, PA  glucose blood [facility] ACTIVE STRIPS) test strip Use as instructed 01/18/12 01/17/13  Elson Areas, PA  glucose blood (ACCU-CHEK INSTANT GLUCOSE TEST) test strip Use as instructed 01/18/12 01/17/13  Elson Areas, PA  UNABLE TO FIND 60 lancets and test strips for glucose meter and management of diabetes with 1 refill. 02/19/12   Cristal Ford, MD   Physical Exam: Filed Vitals:   06/18/12 1351 06/18/12 1652  BP: 130/66 126/65  Pulse: 92 76  Temp: 97.9 F (36.6 C)   TempSrc: Oral   Resp: 20 16  SpO2: 97% 100%    General:  Examined in the emergency department, appears calm and comfortable Eyes: PERRL, normal lids, irises; wears glasses ENT: grossly normal hearing, lips & tongue Neck: no LAD, masses or thyromegaly Cardiovascular: RRR, no m/r/g. No LE edema. Respiratory: CTA bilaterally, no w/r/r. Normal respiratory effort. Abdomen: soft, ntnd Skin: no rash or induration  seen on limited exam Musculoskeletal: grossly normal tone BUE/BLE Psychiatric: grossly normal mood and affect, speech fluent and appropriate Neurologic: grossly non-focal.  Wt Readings from Last 3 Encounters:  No data found for Wt    Labs on Admission:  Basic Metabolic Panel:  Lab 06/18/12 1610 06/18/12 1306  NA 127* 129*  K 5.3* 5.3*  CL 93* 98  CO2 23 --  GLUCOSE 639* 695*  BUN 29* 31*  CREATININE 1.48* 1.70*  CALCIUM 9.6 --  MG -- --  PHOS -- --    CBC:  Lab 06/18/12 1531 06/18/12 1306  WBC 6.9 --  NEUTROABS -- --  HGB 16.3 17.3*  HCT 43.9 51.0  MCV 86.1 --  PLT 158 --   CBG:  Lab 06/18/12 1255  GLUCAP >600*     Radiological Exams on Admission: Dg Chest 2 View  06/18/2012  *RADIOLOGY REPORT*  Clinical Data: Cough, history of CHF, diabetes  CHEST - 2 VIEW  Comparison: None.  Findings: No active infiltrate or effusion is seen.  Mediastinal contours appear normal.  The heart is within upper limits of normal.  No acute skeletal abnormality is seen.  IMPRESSION: No active lung disease.   Original Report Authenticated By: Dwyane Dee, M.D.     EKG: Independently reviewed. As above   Principal Problem:  *Hyperosmolar non-ketotic state in patient with type 2 diabetes mellitus Active Problems:  Diabetes mellitus type 2, uncontrolled  Hyperkalemia  Hypertension  Noncompliance   Assessment/Plan 1. Hyperosmolar Hyperglycemic state: IV fluids, insulin infusion, secondary to noncompliance. 2. Acute renal failure: Secondary to severe hyperglycemia. IV fluids. Repeat basic metabolic panel in the morning. Hold lisinopril for now but likely restart on discharge. 3. Mild hyperkalemia: Likely secondary to acute renal failure, possibly ACE inhibitor. Hold lisinopril for now. EKG unremarkable. Repeat lab study in the morning. Expect improvement with fluids and correction of renal failure. 4. Diabetes mellitus type 2, uncontrolled: Check hemoglobin A1c. Patient not interested in starting insulin, I explained optimal control is not likely to be obtained by oral medications alone. Nevertheless he does not want to start insulin. 5. Hypertension: Stable. 6. History coronary artery disease with stent placement: Stable. Continue aspirin. 7. Noncompliance: Patient states to be more compliant. 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  Code Status: Full Family Communication: None present Disposition  Plan/Anticipated LOS: Admit to inpatient, one to 2 days  Time spent: 55 minutes  Brendia Sacks, MD  [facility] Pager [phone] 06/18/2012, 5:19 PM

## 2012-06-18 NOTE — ED Provider Notes (Signed)
History     CSN: [id number]  Arrival date & time 06/18/12  1341   First MD Initiated Contact with Patient 06/18/12 1507      Chief Complaint  Patient presents with  . Hyperglycemia     HPI Pt was seen at 1530.   Per pt, c/o gradual onset and persistence of constant "high" home CBG's for the past 1 week.  Pt states he has been intermittently taking his metformin for the past 9 months (also the last time he has seen his PMD in [place]).  Has been associated with polyuria and polydipsia.  Pt denies N/V/D, no fevers, no CP/SOB, no abd pain, no back pain.       Past Medical History  Diagnosis Date  . Diabetes mellitus   . Hypertension   . CAD (coronary artery disease)   . Diverticulosis     Past Surgical History  Procedure Date  . Coronary angioplasty with stent placement   . Mouth surgery     cut bone out of upper and lower jaw to prepare for dentures  . Appendectomy     History  Substance Use Topics  . Smoking status: Former Smoker    Quit date: 09/24/2011  . Smokeless tobacco: Not on file  . Alcohol Use: No    Review of Systems ROS: Statement: All systems negative except as marked or noted in the HPI; Constitutional: Negative for fever and chills. ; ; Eyes: Negative for eye pain, redness and discharge. ; ; ENMT: Negative for ear pain, hoarseness, nasal congestion, sinus pressure and sore throat. ; ; Cardiovascular: Negative for chest pain, palpitations, diaphoresis, dyspnea and peripheral edema. ; ; Respiratory: Negative for cough, wheezing and stridor. ; ; Gastrointestinal: Negative for nausea, vomiting, diarrhea, abdominal pain, blood in stool, hematemesis, jaundice and rectal bleeding. . ; ; Genitourinary: Negative for dysuria, flank pain and hematuria. ; ; Musculoskeletal: Negative for back pain and neck pain. Negative for swelling and trauma.; ; Skin: Negative for pruritus, rash, abrasions, blisters, bruising and skin lesion.; ; Neuro: Negative for headache,  lightheadedness and neck stiffness. Negative for weakness, altered level of consciousness , altered mental status, extremity weakness, paresthesias, involuntary movement, seizure and syncope.      Allergies  Review of patient's allergies indicates no known allergies.  Home Medications   Current Outpatient Rx  Name  Route  Sig  Dispense  Refill  . ASPIRIN 325 MG PO TABS   Oral   Take 325 mg by mouth daily.         Marland Kitchen ESCITALOPRAM OXALATE 10 MG PO TABS   Oral   Take 1 tablet (10 mg total) by mouth daily.   30 tablet   2   . OMEGA-3 FATTY ACIDS 1000 MG PO CAPS   Oral   Take 2 g by mouth daily.         Marland Kitchen LISINOPRIL 20 MG PO TABS   Oral   Take 20 mg by mouth daily.         Marland Kitchen METFORMIN HCL 500 MG PO TABS   Oral   Take 1 tablet (500 mg total) by mouth 2 (two) times daily with a meal.   30 tablet   2   . TRAZODONE HCL 100 MG PO TABS   Oral   Take 1 tablet (100 mg total) by mouth at bedtime.   30 tablet   2   . GLUCOSE BLOOD VI STRP      Use as instructed  100 each   12   . GLUCOSE BLOOD VI STRP      Use as instructed   100 each   12   . UNABLE TO FIND      60 lancets and test strips for glucose meter and management of diabetes with 1 refill.   60 Units   1     BP 126/65  Pulse 76  Temp 97.9 F (36.6 C) (Oral)  Resp 16  SpO2 100%  Physical Exam 1535: Physical examination:  Nursing notes reviewed; Vital signs and O2 SAT reviewed;  Constitutional: Well developed, Well nourished, Well hydrated, In no acute distress; Head:  Normocephalic, atraumatic; Eyes: EOMI, PERRL, No scleral icterus; ENMT: Mouth and pharynx normal, Mucous membranes moist; Neck: Supple, Full range of motion, No lymphadenopathy; Cardiovascular: Regular rate and rhythm, No gallop; Respiratory: Breath sounds clear & equal bilaterally, No rales, rhonchi, wheezes.  Speaking full sentences with ease, Normal respiratory effort/excursion; Chest: Nontender, Movement normal; Abdomen: Soft,  Nontender, Nondistended, Normal bowel sounds;; Extremities: Pulses normal, No tenderness, No edema, No calf edema or asymmetry.; Neuro: AA&Ox3, Major CN grossly intact.  Speech clear. No gross focal motor or sensory deficits in extremities.; Skin: Color normal, Warm, Dry.   ED Course  Procedures    MDM  MDM Reviewed: nursing note, vitals and previous chart Reviewed previous: labs Interpretation: labs, ECG and x-ray    Date: 06/18/2012  Rate: 73  Rhythm: normal sinus rhythm  QRS Axis: normal  Intervals: normal  ST/T Wave abnormalities: normal  Conduction Disutrbances:none  Narrative Interpretation:   Old EKG Reviewed: none available.  Results for orders placed during the hospital encounter of 06/18/12  CBC      Component Value Range   WBC 6.9  4.0 - 10.5 K/uL   RBC 5.10  4.22 - 5.81 MIL/uL   Hemoglobin 16.3  13.0 - 17.0 g/dL   HCT 40.9  81.1 - 91.4 %   MCV 86.1  78.0 - 100.0 fL   MCH 32.0  26.0 - 34.0 pg   MCHC 37.1 (*) 30.0 - 36.0 g/dL   RDW 78.2  95.6 - 21.3 %   Platelets 158  150 - 400 K/uL  URINALYSIS, ROUTINE W REFLEX MICROSCOPIC      Component Value Range   Color, Urine YELLOW  YELLOW   APPearance CLEAR  CLEAR   Specific Gravity, Urine 1.036 (*) 1.005 - 1.030   pH 5.5  5.0 - 8.0   Glucose, UA >1000 (*) NEGATIVE mg/dL   Hgb urine dipstick NEGATIVE  NEGATIVE   Bilirubin Urine NEGATIVE  NEGATIVE   Ketones, ur NEGATIVE  NEGATIVE mg/dL   Protein, ur NEGATIVE  NEGATIVE mg/dL   Urobilinogen, UA 0.2  0.0 - 1.0 mg/dL   Nitrite NEGATIVE  NEGATIVE   Leukocytes, UA NEGATIVE  NEGATIVE  BASIC METABOLIC PANEL      Component Value Range   Sodium 127 (*) 135 - 145 mEq/L   Potassium 5.3 (*) 3.5 - 5.1 mEq/L   Chloride 93 (*) 96 - 112 mEq/L   CO2 23  19 - 32 mEq/L   Glucose, Bld 639 (*) 70 - 99 mg/dL   BUN 29 (*) 6 - 23 mg/dL   Creatinine, Ser 0.86 (*) 0.50 - 1.35 mg/dL   Calcium 9.6  8.4 - 57.8 mg/dL   GFR calc non Af Amer 48 (*) >90 mL/min   GFR calc Af Amer 56 (*)  >90 mL/min  URINE MICROSCOPIC-ADD ON  Component Value Range   Squamous Epithelial / LPF RARE  RARE   Dg Chest 2 View 06/18/2012  *RADIOLOGY REPORT*  Clinical Data: Cough, history of CHF, diabetes  CHEST - 2 VIEW  Comparison: None.  Findings: No active infiltrate or effusion is seen.  Mediastinal contours appear normal.  The heart is within upper limits of normal.  No acute skeletal abnormality is seen.  IMPRESSION: No active lung disease.   Original Report Authenticated By: Dwyane Dee, M.D.    Results for ZAKHI, DUPRE (MRN [id number]) as of 06/18/2012 17:23  Ref. Range 10/31/2011 19:35 11/02/2011 18:37 06/18/2012 15:35  BUN Latest Range: 6-23 mg/dL 12 11 29  (H)  Creatinine Latest Range: 0.50-1.35 mg/dL 4.09 8.11 9.14 (H)      1720:  Hyperglycemic but not acidotic with AG 11.  Na corrects to 136 for elevated glucose.  Cr elevated compared to previous.  Will give IVF and start IV glucostabilizer.  Dx and testing d/w pt.  Questions answered.  Verb understanding, agreeable to admit.  T/C to [facility], case discussed, including:  HPI, pertinent PM/SHx, VS/PE, dx testing, ED course and treatment:  Agreeable to admit, requests to write temporary orders, obtain medical bed to team 5.            Laray Anger, DO 06/20/12 [phone]

## 2012-06-18 NOTE — ED Notes (Signed)
[facility] to [facility] for tra[facility]adone refill, sent here due to cbg 695. Pt has not been taking meds consistently over the last 9 months.

## 2012-06-18 NOTE — ED Notes (Signed)
Pt is here for trazadone refill for insomnia   He has been at the [facility] recently

## 2012-06-18 NOTE — ED Provider Notes (Signed)
History     CSN: [id number]  Arrival date & time 06/18/12  1154   First MD Initiated Contact with Patient 06/18/12 1213      Chief Complaint  Patient presents with  . Medication Refill    (Consider location/radiation/quality/duration/timing/severity/associated sxs/prior treatment) HPI Comments: 67 year old male with history of diabetes and polysubstance abuse. (Patient is currently almost completed a 9 month rehabilitation program at [facility] house) came today requesting refills on trazodone for chronic insomnia. He is from [place] and has had a primary care provider there. Has not seen a primary care  providern in 9 months. On questioning about diabetes, the patient states that has not taken consistently metformin in the last 9 months. Blood sugar in 300's range at home. Admits to polyuria and polydipsia and decreased oral intake. Denies dizziness or headache currently. Denies chest pain, abdominal pain, nausea vomiting or diarrhea. No pruritus or jaundice.    Past Medical History  Diagnosis Date  . Diabetes mellitus   . Hypertension   . CAD (coronary artery disease)     Past Surgical History  Procedure Date  . Stint   . Coronary angioplasty with stent placement   . Mouth surgery     cut bone out of upper and lower jaw to prepare for dentures    History reviewed. No pertinent family history.  History  Substance Use Topics  . Smoking status: Former Smoker    Quit date: 09/24/2011  . Smokeless tobacco: Not on file  . Alcohol Use: No      Review of Systems  Constitutional: Positive for appetite change and fatigue. Negative for fever, chills and diaphoresis.  Respiratory: Negative for cough and shortness of breath.   Cardiovascular: Negative for chest pain.  Gastrointestinal: Negative for nausea, vomiting, abdominal pain and diarrhea.  Genitourinary: Negative for dysuria and hematuria.  Skin: Negative for rash.  Neurological: Negative for dizziness and headaches.    Psychiatric/Behavioral:       Chronic insomnia    Allergies  Review of patient's allergies indicates no known allergies.  Home Medications   Current Outpatient Rx  Name  Route  Sig  Dispense  Refill  . ESCITALOPRAM OXALATE 10 MG PO TABS   Oral   Take 1 tablet (10 mg total) by mouth daily.   30 tablet   2   . GLUCOSE BLOOD VI STRP      Use as instructed   100 each   12   . GLUCOSE BLOOD VI STRP      Use as instructed   100 each   12   . LISINOPRIL 20 MG PO TABS   Oral   Take 20 mg by mouth daily.         Marland Kitchen METFORMIN HCL 500 MG PO TABS   Oral   Take 1 tablet (500 mg total) by mouth 2 (two) times daily with a meal.   30 tablet   2   . TRAZODONE HCL 100 MG PO TABS   Oral   Take 1 tablet (100 mg total) by mouth at bedtime.   30 tablet   2   . ALUM SULFATE-CA ACETATE EX PACK      Dissolve 8 packets in 1 gallon of water and soak feet for 5 minutes twice daily   12 each   12   . ASPIRIN 325 MG PO TABS   Oral   Take 325 mg by mouth daily.         Marland Kitchen UNABLE  TO FIND      60 lancets and test strips for glucose meter and management of diabetes with 1 refill.   60 Units   1     BP 111/66  Pulse 98  Temp 98.5 F (36.9 C) (Oral)  Resp 20  SpO2 97%  Physical Exam  Nursing note and vitals reviewed. Constitutional: He is oriented to person, place, and time. He appears well-developed and well-nourished. No distress.  Eyes: Conjunctivae normal and EOM are normal. Pupils are equal, round, and reactive to light. No scleral icterus.  Neck: Neck supple. No JVD present. No thyromegaly present.  Cardiovascular: Regular rhythm and normal heart sounds.  Exam reveals no gallop and no friction rub.   No murmur heard.      Mild tachycardia  Pulmonary/Chest: Effort normal and breath sounds normal. No respiratory distress.  Abdominal: Soft. There is no tenderness.  Neurological: He is alert and oriented to person, place, and time.  Skin: No rash noted. He is not  diaphoretic.    ED Course  Procedures (including critical care time)  Labs Reviewed  GLUCOSE, CAPILLARY - Abnormal; Notable for the following:    Glucose-Capillary >600 (*)     All other components within normal limits  POCT I-STAT, CHEM 8 - Abnormal; Notable for the following:    Sodium 129 (*)     Potassium 5.3 (*)     BUN 31 (*)     Creatinine, Ser 1.70 (*)     Glucose, Bld 695 (*)     Hemoglobin 17.3 (*)     All other components within normal limits   No results found.   1. Hyperglycemia   2. Acute renal failure (ARF)       MDM  67 year old male here with uncontrolled diabetes. (corrected sodium 139) Hyperkalemic with an increase of his creatinine concerning for acute renal failure. Otherwise asymptomatic with stable vital signs. No PCP in [place]. Decided to transfer to the emergency department for further evaluation and management.        Sharin Grave, MD 06/18/12 1332

## 2012-06-18 NOTE — ED Notes (Signed)
Checked patient blood sugar it was 444 notified RN Britta Mccreedy of blood sugar

## 2012-06-19 DIAGNOSIS — N179 Acute kidney failure, unspecified: Secondary | ICD-10-CM | POA: Diagnosis present

## 2012-06-19 LAB — POCT I-STAT 3, ART BLOOD GAS (G3+)
Acid-base deficit: 3 mmol/L — ABNORMAL HIGH (ref 0.0–2.0)
Patient temperature: 98.6

## 2012-06-19 LAB — BASIC METABOLIC PANEL
BUN: 19 mg/dL (ref 6–23)
CO2: 21 mEq/L (ref 19–32)
Calcium: 8.3 mg/dL — ABNORMAL LOW (ref 8.4–10.5)
Creatinine, Ser: 1.03 mg/dL (ref 0.50–1.35)

## 2012-06-19 LAB — GLUCOSE, CAPILLARY
Glucose-Capillary: 160 mg/dL — ABNORMAL HIGH (ref 70–99)
Glucose-Capillary: 191 mg/dL — ABNORMAL HIGH (ref 70–99)
Glucose-Capillary: 204 mg/dL — ABNORMAL HIGH (ref 70–99)
Glucose-Capillary: 205 mg/dL — ABNORMAL HIGH (ref 70–99)
Glucose-Capillary: 361 mg/dL — ABNORMAL HIGH (ref 70–99)

## 2012-06-19 MED ORDER — INSULIN ASPART 100 UNIT/ML ~~LOC~~ SOLN
0.0000 [IU] | Freq: Every day | SUBCUTANEOUS | Status: DC
  Administered 2012-06-19: 2 [IU] via SUBCUTANEOUS

## 2012-06-19 MED ORDER — METFORMIN HCL 500 MG PO TABS
1000.0000 mg | ORAL_TABLET | Freq: Two times a day (BID) | ORAL | Status: DC
  Administered 2012-06-19 – 2012-06-20 (×2): 1000 mg via ORAL
  Filled 2012-06-19 (×4): qty 2

## 2012-06-19 MED ORDER — DEXTROSE 50 % IV SOLN: 50.0000 mL | Freq: Once | INTRAVENOUS | Status: AC | PRN

## 2012-06-19 MED ORDER — BD GETTING STARTED TAKE HOME KIT: 3/10ML X 30G SYRINGES
1.0000 | Freq: Once | Status: AC
  Administered 2012-06-19: 1
  Filled 2012-06-19: qty 1

## 2012-06-19 MED ORDER — LIVING WELL WITH DIABETES BOOK
Freq: Once | Status: AC
  Administered 2012-06-19: 19:00:00
  Filled 2012-06-19: qty 1

## 2012-06-19 MED ORDER — INSULIN GLARGINE 100 UNIT/ML ~~LOC~~ SOLN
10.0000 [IU] | Freq: Every day | SUBCUTANEOUS | Status: DC
  Administered 2012-06-19: 10 [IU] via SUBCUTANEOUS

## 2012-06-19 MED ORDER — INSULIN ASPART 100 UNIT/ML ~~LOC~~ SOLN
3.0000 [IU] | Freq: Three times a day (TID) | SUBCUTANEOUS | Status: DC
  Administered 2012-06-19 – 2012-06-20 (×3): 3 [IU] via SUBCUTANEOUS

## 2012-06-19 MED ORDER — INSULIN GLARGINE 100 UNIT/ML ~~LOC~~ SOLN
15.0000 [IU] | Freq: Every day | SUBCUTANEOUS | Status: DC
  Administered 2012-06-19: 15 [IU] via SUBCUTANEOUS

## 2012-06-19 MED ORDER — DEXTROSE 50 % IV SOLN: 25.0000 mL | Freq: Once | INTRAVENOUS | Status: AC | PRN

## 2012-06-19 MED ORDER — GLUCOSE 40 % PO GEL: 1.0000 | ORAL | Status: DC | PRN

## 2012-06-19 MED ORDER — INSULIN ASPART 100 UNIT/ML ~~LOC~~ SOLN
0.0000 [IU] | Freq: Three times a day (TID) | SUBCUTANEOUS | Status: DC
  Administered 2012-06-19: 5 [IU] via SUBCUTANEOUS
  Administered 2012-06-19: 15 [IU] via SUBCUTANEOUS
  Administered 2012-06-19: 5 [IU] via SUBCUTANEOUS
  Administered 2012-06-20: 8 [IU] via SUBCUTANEOUS
  Administered 2012-06-20: 5 [IU] via SUBCUTANEOUS

## 2012-06-19 NOTE — Progress Notes (Signed)
Inpatient Diabetes Program Recommendations  AACE/ADA: New Consensus Statement on Inpatient Glycemic Control (2013)  Target Ranges:  Prepandial:   less than 140 mg/dL      Peak postprandial:   less than 180 mg/dL (1-2 hours)      Critically ill patients:  140 - 180 mg/dL   Reason for Visit: Uncontrolled DM  67 year old man with history of diabetes mellitus presented to urgent care today requesting refill on trazodone and blood sugar check. Blood sugar greater than 600 and referred to the emergency department where he was found to have acute renal failure and severe hyperglycemia. Patient reports noncompliance with metformin secondary to "negligence". He has had some polyuria and polydipsia but overall is felt well lately. He has noticed high blood sugars for a week.    Results for ERRIN, CHEWNING (MRN [id number]) as of 06/19/2012 16:09  Ref. Range 06/19/2012 01:36 06/19/2012 02:39 06/19/2012 03:45 06/19/2012 04:51 06/19/2012 07:58  Glucose-Capillary Latest Range: 70-99 mg/dL 409 (H) 811 (H) 914 (H) 158 (H) 223 (H)  Results for JERARD, BAYS (MRN [id number]) as of 06/19/2012 16:09  Ref. Range 06/18/2012 15:35 06/19/2012 06:15  Sodium Latest Range: 135-145 mEq/L 127 (L) 135  Potassium Latest Range: 3.5-5.1 mEq/L 5.3 (H) 3.7  Chloride Latest Range: 96-112 mEq/L 93 (L) 104  CO2 Latest Range: 19-32 mEq/L 23 21  Mean Plasma Glucose Latest Range: <117 mg/dL  086 (H)  BUN Latest Range: 6-23 mg/dL 29 (H) 19  Creatinine Latest Range: 0.50-1.35 mg/dL 5.78 (H) 4.69  Calcium Latest Range: 8.4-10.5 mg/dL 9.6 8.3 (L)  GFR calc non Af Amer Latest Range: >90 mL/min 48 (L) 75 (L)  GFR calc Af Amer Latest Range: >90 mL/min 56 (L) 87 (L)  Glucose Latest Range: 70-99 mg/dL 629 (HH) 528 (H)   Results for AUSTAN, NICHOLL (MRN [id number]) as of 06/19/2012 16:09  Ref. Range 06/19/2012 06:15  Hemoglobin A1C Latest Range: <5.7 % 15.9 (H)    Inpatient Diabetes Program Recommendations Insulin - Basal: Consider changing  insulin to 70/30 15 units bid and titrate up until CBGs >180 mg/dL  Pt states he is moving back to [place] on Sunday and will obtain PCP to manage DM.  Long discussion with Mr. Tolliver regarding importance of controlling blood sugars to prevent complications.  Stressed importance of taking meds and checking blood sugars at least 3 times/day.  Discussed hypoglycemia S/S and treatment.  Pt instructed on viewing diabetes videos on pt ed channel and verbalized understanding.  Will order insulin starter kit and RN to begin insulin administration teaching.  Also recommend changing Lantus to 70/30 as pt will probably be more compliant with 2 shots/day instead of 4.  Agrees to learn how to inject insulin.  Will followup tomorrow am.  Discussed with RN.  Thank you. Ailene Ards, RD, LDN, CDE Inpatient Diabetes Coordinator [phone]

## 2012-06-19 NOTE — Progress Notes (Signed)
Addendum  Patient seen and examined, chart and data base reviewed.  I agree with the above assessment and plan.  For full details please see Mrs. Toya Smothers NP note.  Hyperosmolar hyperglycemic state, acute renal failure and dehydration.  He will need insulin on discharge, hemoglobin A1c is 15.9 correlate with mean plasma glucose of 410.   Clint Lipps, MD [facility] Pager: [phone] 06/19/2012, 1:54 PM

## 2012-06-19 NOTE — Progress Notes (Signed)
[facility] PROGRESS NOTE  Nicholas Farrell ZOX:[id number] DOB: May 22, 1946 DOA: 06/18/2012 PCP: Default, Provider, MD  Assessment/Plan: Hyperosmolar Hyperglycemic state: due to non-compliance. Much improved. Iv insulin discontinued. Moderate SSI with lantus 10 units started.  CBG range 158-223. Will start meal coverage at 3units as pt eating well.  continue IV fluids. Electrolytes WNL.  Acute renal failure: Secondary to severe hyperglycemia. Resolved. Continue IV fluids. Repeat basic metabolic panel in the morning.  Continue to hold lisinopril for now but likely restart on discharge. SBP range 109-132.  Mild hyperkalemia:  Resolved. Likely secondary to acute renal failure, possibly ACE inhibitor. Hold lisinopril for now. EKG unremarkable.    Diabetes mellitus type 2, uncontrolled:  hemoglobin A1c 15.9. Patient not interested in starting insulin states "i cant stick myself". I explained that we would teach him. Explained that he would not likely be able to avoid repeated hospitalizations without insulin. He agreed to speak to diabetic coordinator See problem #1. Hypertension: Stable.  History coronary artery disease with stent placement: Stable. Continue aspirin.  Noncompliance: Reluctantly verbalizes willingness to be more proactive in managing his disease. Primary care physician in [place] and will followup there on discharge  Code Status: full Family Communication:  Disposition Plan: home likely tomorrow   Consultants:  none  Procedures:  none  Antibiotics:  none  HPI/Subjective: Awake alert oriented x3. NAD  Objective: Filed Vitals:   06/18/12 1652 06/18/12 1916 06/18/12 1949 06/19/12 0454  BP: 126/65 119/64 132/75 109/58  Pulse: 76 81 83 73  Temp:   98.9 F (37.2 C) 98.5 F (36.9 C)  TempSrc:   Oral Oral  Resp: 16 26 22 20   Height:   5\' 9"  (1.753 m)   Weight:   90.1 kg (198 lb 10.2 oz)   SpO2: 100% 100% 97% 98%    Intake/Output Summary (Last 24 hours) at  06/19/12 1122 Last data filed at 06/19/12 0510  Gross per 24 hour  Intake 1531.76 ml  Output      0 ml  Net 1531.76 ml   Filed Weights   06/18/12 1949  Weight: 90.1 kg (198 lb 10.2 oz)    Exam:   General:  Awake alert obese  Cardiovascular: RRR No MGR No LEE  Respiratory: normal effort BSCTAB No wheeze  Abdomen: obese soft +BS non-tender to palpation  Data Reviewed: Basic Metabolic Panel:  Lab 06/19/12 8119 06/18/12 1535 06/18/12 1306  NA 135 127* 129*  K 3.7 5.3* 5.3*  CL 104 93* 98  CO2 21 23 --  GLUCOSE 187* 639* 695*  BUN 19 29* 31*  CREATININE 1.03 1.48* 1.70*  CALCIUM 8.3* 9.6 --  MG -- -- --  PHOS -- -- --   Liver Function Tests: No results found for this basename: AST:5,ALT:5,ALKPHOS:5,BILITOT:5,PROT:5,ALBUMIN:5 in the last 168 hours No results found for this basename: LIPASE:5,AMYLASE:5 in the last 168 hours No results found for this basename: AMMONIA:5 in the last 168 hours CBC:  Lab 06/18/12 1531 06/18/12 1306  WBC 6.9 --  NEUTROABS -- --  HGB 16.3 17.3*  HCT 43.9 51.0  MCV 86.1 --  PLT 158 --   Cardiac Enzymes: No results found for this basename: CKTOTAL:5,CKMB:5,CKMBINDEX:5,TROPONINI:5 in the last 168 hours BNP (last 3 results) No results found for this basename: PROBNP:3 in the last 8760 hours CBG:  Lab 06/19/12 0758 06/19/12 0451 06/19/12 0345 06/19/12 0239 06/19/12 0136  GLUCAP 223* 158* 204* 180* 164*    No results found for this or any previous visit (from the  past 240 hour(s)).   Studies: Dg Chest 2 View  06/18/2012  *RADIOLOGY REPORT*  Clinical Data: Cough, history of CHF, diabetes  CHEST - 2 VIEW  Comparison: None.  Findings: No active infiltrate or effusion is seen.  Mediastinal contours appear normal.  The heart is within upper limits of normal.  No acute skeletal abnormality is seen.  IMPRESSION: No active lung disease.   Original Report Authenticated By: Dwyane Dee, M.D.     Scheduled Meds:   . aspirin  325 mg Oral Daily    . escitalopram  10 mg Oral Daily  . insulin aspart  0-15 Units Subcutaneous TID WC  . insulin aspart  0-5 Units Subcutaneous QHS  . insulin glargine  10 Units Subcutaneous QHS  . traZODone  100 mg Oral QHS   Continuous Infusions:   . sodium chloride 100 mL/hr at 06/19/12 0402    Principal Problem:  *Hyperosmolar non-ketotic state in patient with type 2 diabetes mellitus Active Problems:  Diabetes mellitus type 2, uncontrolled  Hyperkalemia  Hypertension  Noncompliance    Time spent: 30 minutes    [facility] M  [facility]  If 8PM-8AM, please contact night-coverage at [email or web address], password [facility] 06/19/2012, 11:22 AM  LOS: 1 day

## 2012-06-19 NOTE — Care Management Note (Signed)
    Page 1 of 1   06/24/2012     2:10:16 PM   CARE MANAGEMENT NOTE 06/24/2012  Patient:  Nicholas Farrell, Nicholas Farrell   Account Number:  [id number]  Date Initiated:  06/19/2012  Documentation initiated by:  Letha Cape  Subjective/Objective Assessment:   dx hyperosmolar non ketotic dm  admit- from [facility] house     Action/Plan:   Anticipated DC Date:  06/20/2012   Anticipated DC Plan:  HOME/SELF CARE      DC Planning Services  CM consult      Choice offered to / List presented to:             Status of service:  Completed, signed off Medicare Important Message given?   (If response is "NO", the following Medicare IM given date fields will be blank) Date Medicare IM given:   Date Additional Medicare IM given:    Discharge Disposition:  HOME/SELF CARE  Per UR Regulation:  Reviewed for med. necessity/level of care/duration of stay  If discussed at Long Length of Stay Meetings, dates discussed:    Comments:  06/20/12 17:13 Letha Cape RN, BSN  [phone] patient is from [employer], he has medication coverage and transportation per patient. Patient is for possible dc tomorrow , he has a graduation he needs to get to this weekend.  No needs anticipated.

## 2012-06-19 NOTE — Progress Notes (Signed)
Utilization Review Completed.   Sayuri Rhames, RN, BSN Nurse Case Manager  [phone]  

## 2012-06-20 DIAGNOSIS — N179 Acute kidney failure, unspecified: Secondary | ICD-10-CM

## 2012-06-20 LAB — URINE CULTURE: Colony Count: 9000

## 2012-06-20 MED ORDER — INSULIN GLARGINE 100 UNIT/ML ~~LOC~~ SOLN: 20.0000 [IU] | Freq: Every day | SUBCUTANEOUS | Status: DC

## 2012-06-20 MED ORDER — ACETAMINOPHEN 325 MG PO TABS: 650.0000 mg | ORAL_TABLET | Freq: Four times a day (QID) | ORAL | Status: DC | PRN

## 2012-06-20 MED ORDER — METFORMIN HCL 1000 MG PO TABS: 1000.0000 mg | ORAL_TABLET | Freq: Two times a day (BID) | ORAL | Status: DC

## 2012-06-20 MED ORDER — TRAZODONE HCL 100 MG PO TABS: 100.0000 mg | ORAL_TABLET | Freq: Every day | ORAL | Status: DC

## 2012-06-20 MED ORDER — ALUM & MAG HYDROXIDE-SIMETH 200-200-20 MG/5ML PO SUSP: 30.0000 mL | Freq: Four times a day (QID) | ORAL | Status: DC | PRN

## 2012-06-20 NOTE — Discharge Summary (Signed)
Addendum  Patient seen and examined, chart and data base reviewed.  I agree with the above assessment and discharge plan.  For full details please see Mrs. Toya Smothers NP note.  Uncontrolled diabetes mellitus, hemoglobin A1c 15.9 correlate with mean blood glucose of 410.  Patient presented with hypoglycemic hyperosmolar state, discharge on 20 units of Lantus insulin.  Discharge only on Lantus and metformin to simplify his regimen, probably he will need adjustment.   Clint Lipps, MD [facility] Pager: [phone] 06/20/2012, 1:08 PM

## 2012-06-20 NOTE — Discharge Summary (Signed)
Physician Discharge Summary  Nicholas Farrell ZOX:[id number] DOB: 07/12/1945 DOA: 06/18/2012  PCP: Default, Provider, MD  Admit date: 06/18/2012 Discharge date: 06/20/2012  Time spent: 40 minutes  Recommendations for Outpatient Follow-up:  1. Appointment with PCP in [place] in 1 week 2. Recommend keeping a journal of all CBG readings until appointment with PCP  Discharge Diagnoses:  Principal Problem:  *Hyperosmolar non-ketotic state in patient with type 2 diabetes mellitus Active Problems:  Diabetes mellitus type 2, uncontrolled  Hyperkalemia  Hypertension  Noncompliance  ARF (acute renal failure)   Discharge Condition: stable and ready for discharge home  Diet recommendation: carb modified  Filed Weights   06/18/12 1949  Weight: 90.1 kg (198 lb 10.2 oz)    History of present illness:  67 year old man with history of diabetes mellitus presented to urgent care on 06/18/12 requesting refill on trazodone and blood sugar check. Blood sugar greater than 600 and referred to the emergency department where he was found to have acute renal failure and severe hyperglycemia. Patient reported noncompliance with metformin secondary to "negligence". He reported some polyuria and polydipsia but overall  felt well lately. He  noticed high blood sugars for a week. In the emergency department noted to be afebrile with stable vital signs. Glucose 695, acute renal failure with creatinine 1.7. Chest x-ray negative. EKG sinus rhythm with no acute changes. Given IV fluids and started on IV insulin. Admitted to [facility] Course:  Hyperosmolar Hyperglycemic state: due to non-compliance and negligence. Pt admitted to [place] on IV insulin. Quickly responed positively to treatment and insulin drip discontinued early morning of 06/19/12. Pt started on lantus 15u and moderate SSI. Home metformin increased from 500BID to 1000BID.  CBG range at discharge 204-361. Will increase lantus to 20u. Pt. Also met with  diabetes coordinator and provided with education and counseling regarding management of diabetes and insulin administration. Pt demonstrated ability to administer insulin to self. At discharge electrolytes WNL.   Acute renal failure: Secondary to severe hyperglycemia. Resolved with IV fluids and initial holding of lisinopril. Lisinopril resumed at discharge.    Mild hyperkalemia: Resolved. Likely secondary to acute renal failure, possibly ACE inhibitor. Hold lisinopril for now. EKG unremarkable.   Diabetes mellitus type 2, uncontrolled: hemoglobin A1c 15.9. Patient not interested in starting insulin states "i cant stick myself". I explained that we would teach him. Explained that he would not likely be able to avoid repeated hospitalizations without insulin. He agreed to speak to diabetic coordinator See problem #1.   Hypertension: good control. At discharge SBP range 109-123.    History coronary artery disease with stent placement: Stable during this hospitalization. Continue aspirin.   Noncompliance: Reluctantly verbalizes willingness to be more proactive in managing his disease.  See #1 for details. Primary care physician in [place] and will followup there on discharge   Procedures:  none  Consultations:  none  Discharge Exam: Filed Vitals:   06/19/12 0454 06/19/12 1441 06/19/12 2152 06/20/12 0638  BP: 109/58 111/66 125/73 111/63  Pulse: 73 85 73 77  Temp: 98.5 F (36.9 C) 99.6 F (37.6 C) 98.4 F (36.9 C) 98.6 F (37 C)  TempSrc: Oral Oral Oral Oral  Resp: 20 18 20    Height:      Weight:      SpO2: 98% 96% 100% 99%    General: awake alert well nourished  Cardiovascular: RR No MGR No LEE Respiratory: normal effort BSCTAB No wheeze/rhonchi  Discharge Instructions  Discharge Orders  Future Orders Please Complete By Expires   Diet - low sodium heart healthy      Increase activity slowly      Discharge instructions      Comments:   Check blood sugar 4x daily  with meals and at bedtime. Keep a journal of all CBG readings daily . Take the journal to PCP in 1 week for evaluation       Medication List     As of 06/20/2012 11:09 AM    TAKE these medications         acetaminophen 325 MG tablet   Commonly known as: TYLENOL   Take 2 tablets (650 mg total) by mouth every 6 (six) hours as needed (or Fever >/= 101).      alum & mag hydroxide-simeth 200-200-20 MG/5ML suspension   Commonly known as: MAALOX/MYLANTA   Take 30 mLs by mouth every 6 (six) hours as needed (dyspepsia).      aspirin 325 MG tablet   Take 325 mg by mouth daily.      escitalopram 10 MG tablet   Commonly known as: LEXAPRO   Take 1 tablet (10 mg total) by mouth daily.      fish oil-omega-3 fatty acids 1000 MG capsule   Take 2 g by mouth daily.      glucose blood test strip   Use as instructed      glucose blood test strip   Use as instructed      insulin glargine 100 UNIT/ML injection   Commonly known as: LANTUS   Inject 20 Units into the skin at bedtime.      lisinopril 20 MG tablet   Commonly known as: PRINIVIL,ZESTRIL   Take 20 mg by mouth daily.      metFORMIN 1000 MG tablet   Commonly known as: GLUCOPHAGE   Take 1 tablet (1,000 mg total) by mouth 2 (two) times daily with a meal.      traZODone 100 MG tablet   Commonly known as: DESYREL   Take 1 tablet (100 mg total) by mouth at bedtime.      UNABLE TO FIND   60 lancets and test strips for glucose meter and management of diabetes with 1 refill.           Follow-up Information    Please follow up.   Contact information:   Pt resides in [place]. will be returning in 2 days and will see PCP there. recommend appointment in 1 week to evaluate CBG's and insulin dose          The results of significant diagnostics from this hospitalization (including imaging, microbiology, ancillary and laboratory) are listed below for reference.    Significant Diagnostic Studies: Dg Neck Soft  Tissue  05/30/2012  *RADIOLOGY REPORT*  Clinical Data: Hoarse voice, worse for 3 weeks, worsening, history of oral surgery (02/26/2012).  NECK SOFT TISSUES - 1+ VIEW  Comparison: None.  Findings:  Normal appearance of the prevertebral soft tissues.  Normal appearance of the epiglottis and aryepiglottic folds. No definite adenoidal hypertrophy.  Tracheal air column is midline.  Calcifications overlie the central location of the right carotid bulb.  No radiopaque foreign body.  No focal airspace opacities within the visualized lung apices.  C1 to the superior endplate of T2 is visualized on the lateral radiograph.  Normal alignment of the cervical spine.  No definite anterolisthesis.  Cervical vertebral body heights are preserved.  There is mild multilevel cervical spine DDD, worse at  C5 - C6 and C6 - C7 with disc space height loss, end plate irregularity and anteriorly directed osteophytosis.  IMPRESSION: 1.  Normal appearance of the prevertebral soft tissues. 2.  Calcifications overlie the expected location of the right carotid bulb.  Further evaluation with carotid Doppler ultrasound may be performed as clinically indicated. 3.  Mild multilevel cervical spine DDD.   Original Report Authenticated By: Tacey Ruiz, MD    Dg Chest 2 View  06/18/2012  *RADIOLOGY REPORT*  Clinical Data: Cough, history of CHF, diabetes  CHEST - 2 VIEW  Comparison: None.  Findings: No active infiltrate or effusion is seen.  Mediastinal contours appear normal.  The heart is within upper limits of normal.  No acute skeletal abnormality is seen.  IMPRESSION: No active lung disease.   Original Report Authenticated By: Dwyane Dee, M.D.     Microbiology: Recent Results (from the past 240 hour(s))  URINE CULTURE     Status: Normal   Collection Time   06/18/12  4:44 PM      Component Value Range Status Comment   Specimen Description URINE, RANDOM   Final    Special Requests NONE   Final    Culture  Setup Time 06/19/2012 03:08   Final     Colony Count 9,000 COLONIES/ML   Final    Culture INSIGNIFICANT GROWTH   Final    Report Status 06/20/2012 FINAL   Final      Labs: Basic Metabolic Panel:  Lab 06/19/12 9604 06/18/12 1535 06/18/12 1306  NA 135 127* 129*  K 3.7 5.3* 5.3*  CL 104 93* 98  CO2 21 23 --  GLUCOSE 187* 639* 695*  BUN 19 29* 31*  CREATININE 1.03 1.48* 1.70*  CALCIUM 8.3* 9.6 --  MG -- -- --  PHOS -- -- --   Liver Function Tests: No results found for this basename: AST:5,ALT:5,ALKPHOS:5,BILITOT:5,PROT:5,ALBUMIN:5 in the last 168 hours No results found for this basename: LIPASE:5,AMYLASE:5 in the last 168 hours No results found for this basename: AMMONIA:5 in the last 168 hours CBC:  Lab 06/18/12 1531 06/18/12 1306  WBC 6.9 --  NEUTROABS -- --  HGB 16.3 17.3*  HCT 43.9 51.0  MCV 86.1 --  PLT 158 --   Cardiac Enzymes: No results found for this basename: CKTOTAL:5,CKMB:5,CKMBINDEX:5,TROPONINI:5 in the last 168 hours BNP: BNP (last 3 results) No results found for this basename: PROBNP:3 in the last 8760 hours CBG:  Lab 06/20/12 0755 06/19/12 2156 06/19/12 1718 06/19/12 1202 06/19/12 0758  GLUCAP 253* 204* 205* 361* 223*       Signed:  Jasir Rother M  [facility] 06/20/2012, 11:09 AM

## 2012-06-20 NOTE — Progress Notes (Signed)
Inpatient Diabetes Program Recommendations  AACE/ADA: New Consensus Statement on Inpatient Glycemic Control (2013)  Target Ranges:  Prepandial:   less than 140 mg/dL      Peak postprandial:   less than 180 mg/dL (1-2 hours)      Critically ill patients:  140 - 180 mg/dL   Reason for Visit: Hyperglycemia  Pt to be discharged home on Lantus 20 units QHS.  Will followup with PCP in [place] when he moves on Sunday.  Gave himself insulin last night.  Discussed importance of taking meds as prescribed and checking blood sugars and keeping log for MD to adjust insulin.  Discussed hypoglycemia and treatment.  Also being d/c'ed on metformin 1000 mg bid. Answered questions and pt verbalized understanding.  Results for Nicholas Farrell, Nicholas Farrell (MRN [id number]) as of 06/20/2012 11:59  Ref. Range 06/19/2012 07:58 06/19/2012 12:02 06/19/2012 17:18 06/19/2012 21:56 06/20/2012 07:55  Glucose-Capillary Latest Range: 70-99 mg/dL 409 (H) 811 (H) 914 (H) 204 (H) 253 (H)

## 2012-06-20 NOTE — Progress Notes (Signed)
Pt. discharged to floor,verbalized understanding of discharged instruction,medication,restriction,diet and follow up appointment.Baseline Vitals sign stable,Pt comfortable,no sign and symptom of distress. 

## 2012-06-25 ENCOUNTER — Ambulatory Visit: Payer: Medicare Other | Admitting: Cardiology

## 2012-08-27 ENCOUNTER — Ambulatory Visit (INDEPENDENT_AMBULATORY_CARE_PROVIDER_SITE_OTHER): Payer: Medicare Other | Admitting: Cardiovascular Disease

## 2012-08-27 ENCOUNTER — Encounter: Payer: Self-pay | Admitting: Cardiovascular Disease

## 2012-08-27 VITALS — BP 160/78 | HR 76 | Ht 69.0 in | Wt 216.0 lb

## 2012-08-27 DIAGNOSIS — I10 Essential (primary) hypertension: Secondary | ICD-10-CM

## 2012-08-27 DIAGNOSIS — Z86711 Personal history of pulmonary embolism: Secondary | ICD-10-CM

## 2012-08-27 DIAGNOSIS — I2510 Atherosclerotic heart disease of native coronary artery without angina pectoris: Secondary | ICD-10-CM

## 2012-08-27 LAB — BASIC METABOLIC PANEL
Calcium: 9.3 mg/dL (ref 8.4–10.5)
GFR: 85.5 mL/min (ref >60.00)
Glucose, Bld: 185 mg/dL — ABNORMAL HIGH (ref 70–99)
Potassium: 3.4 mEq/L — ABNORMAL LOW (ref 3.5–5.1)
Sodium: 139 mEq/L (ref 135–145)

## 2012-08-27 MED ORDER — AMLODIPINE BESYLATE 5 MG PO TABS: 5.0000 mg | ORAL_TABLET | Freq: Every day | ORAL | Status: DC

## 2012-08-27 MED ORDER — LISINOPRIL 20 MG PO TABS: 20.0000 mg | ORAL_TABLET | Freq: Every day | ORAL | Status: AC

## 2012-08-27 NOTE — Progress Notes (Signed)
HPI:   67 year old gentleman presenting for preoperative evaluation. 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 He is very bothered by his hoarseness, especially since he enjoys singing. He is planning on surgical treatment. He is referred for preoperative evaluation.  The patient has a history of coronary artery disease. He had a myocardial infarction in 2011 and was treated with stenting of the obtuse marginal branch of the circumflex. This occurred in [place]. The patient has a stent card, but I do not have any records of his cardiac disease. He was followed briefly by a cardiologist there after his stent was placed. He's had no residual problems with angina or congestive heart failure.  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 He is currently living here in [place] in the [place] house. He's had one relapse but has done very well for the last several months. He is committed to staying clean.  His past medical history also includes pulmonary embolus and stroke. He has some mild numbness on the right side that is residual from his stroke. He was apparently on warfarin for about a year after his pulmonary embolus but has not been anticoagulated for at least the past few years.  He can walk up to 2 miles without significant cardiac symptoms. He specifically denies exertional chest pain or pressure, dyspnea, lightheadedness, palpitations, or syncope.  Outpatient Encounter Prescriptions as of 08/27/2012  Medication Sig Dispense Refill  . acetaminophen (TYLENOL) 325 MG tablet Take 2 tablets (650 mg total) by mouth every 6 (six) hours as needed (or Fever >/= 101).      Marland Kitchen aspirin 325 MG tablet Take 325 mg by mouth daily.      Marland Kitchen escitalopram (LEXAPRO) 10 MG tablet Take 1 tablet (10 mg total) by mouth daily.  30  tablet  2  . fish oil-omega-3 fatty acids 1000 MG capsule Take 2 g by mouth daily.      . insulin glargine (LANTUS) 100 UNIT/ML injection Inject 20 Units into the skin at bedtime.  10 mL  0  . lisinopril (PRINIVIL,ZESTRIL) 20 MG tablet Take 20 mg by mouth daily.      . metFORMIN (GLUCOPHAGE) 1000 MG tablet Take 1 tablet (1,000 mg total) by mouth 2 (two) times daily with a meal.  90 tablet  0  . traZODone (DESYREL) 100 MG tablet Take 150 mg by mouth at bedtime.      . [DISCONTINUED] traZODone (DESYREL) 100 MG tablet Take 1 tablet (100 mg total) by mouth at bedtime.  30 tablet  0  . glucose blood (ACCU-CHEK ACTIVE STRIPS) test strip Use as instructed  100 each  12  . glucose blood (ACCU-CHEK INSTANT GLUCOSE TEST) test strip Use as instructed  100 each  12  . UNABLE TO FIND 60 lancets and test strips for glucose meter and management of diabetes with 1 refill.  60 Units  1  . [DISCONTINUED] alum & mag hydroxide-simeth (MAALOX/MYLANTA) 200-200-20 MG/5ML suspension Take 30 mLs by mouth every 6 (six) hours as needed (dyspepsia).  355 mL     No facility-administered encounter medications on file as of 08/27/2012.    Review of patient's allergies indicates no known allergies.  Past Medical History  Diagnosis Date  . Diabetes mellitus   . Hypertension   . CAD (coronary artery disease)   .  Diverticulosis     Past Surgical History  Procedure Laterality Date  . Coronary angioplasty with stent placement    . Mouth surgery      cut bone out of upper and lower jaw to prepare for dentures    History   Social History  . Marital Status: Single    Spouse Name: N/A    Number of Children: N/A  . Years of Education: N/A   Occupational History  . Not on file.   Social History Main Topics  . Smoking status: Former Smoker    Quit date: 09/24/2011  . Smokeless tobacco: Not on file  . Alcohol Use: No  . Drug Use: No     Comment: histort  drug  abuse   . Sexually Active:    Other Topics Concern    . Not on file   Social History Narrative  . No narrative on file    Family history:  No history of premature coronary artery disease.  ROS:  General: no fevers/chills/night sweats Eyes: no blurry vision, diplopia, or amaurosis ENT: See history of present illness. There is no hearing loss. Resp: no cough, wheezing, or hemoptysis CV: no edema or palpitations GI: no abdominal pain, nausea, vomiting, diarrhea, or constipation GU: no dysuria, frequency, or hematuria Skin: no rash Neuro: no headache, numbness, tingling, or weakness of extremities Musculoskeletal: no joint pain or swelling Heme: no bleeding, DVT, or easy bruising Endo: no polydipsia or polyuria  BP 160/78  Pulse 76  Ht 5\' 9"  (1.753 m)  Wt 216 lb (97.977 kg)  BMI 31.88 kg/m2  PHYSICAL EXAM: Pt is alert and oriented, WD, WN, in no distress. HEENT: normal Neck: JVP normal. Carotid upstrokes normal without bruits. No thyromegaly. Lungs: equal expansion, clear bilaterally CV: Apex is discrete and nondisplaced, RRR without murmur or gallop Abd: soft, NT, +BS, no bruit, no hepatosplenomegaly Back: no CVA tenderness Ext: no C/C/E        Femoral pulses 2+= without bruits        DP/PT pulses intact and = Skin: warm and dry without rash Neuro: CNII-XII intact             Strength intact = bilaterally  EKG:  Normal sinus rhythm 66 beats per minute, right atrial enlargement, possible left atrial enlargement, mildly prolonged QT   ASSESSMENT AND PLAN: 1. Coronary atherosclerosis, native vessel. The patient is asymptomatic. He can clearly achieve greater than 4 metastases without cardiac symptoms. We will request his records related to his previous cardiac disease. I think he should have an echocardiogram since he's had previous myocardial infarction and pulmonary embolus. This will evaluate both LV and right ventricular function. Pending the results of his echo, he should be able to proceed with surgery without further  testing.  2. Hypertension, poorly controlled. He has run out of medication. He will resume lisinopril. I've recommended that we at amlodipine 5 mg daily.  3. Lipid status. Will check lipids when he returns for his echocardiogram and probably start him on a statin drug.  For followup I will see him back in 6 months. We'll contact him when his echo is completed so that he can have final clearance for surgery. I appreciate the opportunity to see this nice gentleman.  Tonny Bollman 08/27/2012 5:48 PM

## 2012-08-27 NOTE — Patient Instructions (Signed)
Your physician recommends that you have lab work today: [facility]  Your physician has requested that you have an echocardiogram. Echocardiography is a painless test that uses sound waves to create images of your heart. It provides your doctor with information about the size and shape of your heart and how well your heart's chambers and valves are working. This procedure takes approximately one hour. There are no restrictions for this procedure.  Your physician has recommended you make the following change in your medication: RESTART Lisinopril, START Amlodipine 5mg  take one by mouth daily  Your physician wants you to follow-up in: 6 MONTHS with Dr Excell Seltzer.  You will receive a reminder letter in the mail two months in advance. If you don't receive a letter, please call our office to schedule the follow-up appointment.

## 2012-08-28 ENCOUNTER — Other Ambulatory Visit: Payer: Self-pay

## 2012-08-28 ENCOUNTER — Telehealth: Payer: Self-pay | Admitting: Cardiovascular Disease

## 2012-08-28 DIAGNOSIS — I10 Essential (primary) hypertension: Secondary | ICD-10-CM

## 2012-08-28 DIAGNOSIS — I2510 Atherosclerotic heart disease of native coronary artery without angina pectoris: Secondary | ICD-10-CM

## 2012-08-28 MED ORDER — POTASSIUM CHLORIDE ER 10 MEQ PO TBCR: 10.0000 meq | EXTENDED_RELEASE_TABLET | Freq: Every day | ORAL | Status: AC

## 2012-08-28 NOTE — Telephone Encounter (Signed)
ROi faxed to [facility] @ [phone] 08/28/12/km

## 2012-09-04 ENCOUNTER — Other Ambulatory Visit: Payer: Medicare Other

## 2012-09-04 ENCOUNTER — Ambulatory Visit (HOSPITAL_COMMUNITY): Payer: Medicare Other | Attending: Internal Medicine | Admitting: Radiology

## 2012-09-04 DIAGNOSIS — Z01810 Encounter for preprocedural cardiovascular examination: Secondary | ICD-10-CM | POA: Insufficient documentation

## 2012-09-04 DIAGNOSIS — Z86711 Personal history of pulmonary embolism: Secondary | ICD-10-CM

## 2012-09-04 DIAGNOSIS — I2510 Atherosclerotic heart disease of native coronary artery without angina pectoris: Secondary | ICD-10-CM | POA: Insufficient documentation

## 2012-09-04 DIAGNOSIS — I10 Essential (primary) hypertension: Secondary | ICD-10-CM

## 2012-09-04 NOTE — Progress Notes (Signed)
Echocardiogram performed.  

## 2012-09-08 ENCOUNTER — Telehealth: Payer: Self-pay | Admitting: Cardiovascular Disease

## 2012-09-08 NOTE — Telephone Encounter (Signed)
New problem   Pt want to know if cardiac clearance has been fax to Dr Jearld Fenton office, because pt waiting to have sx on his throat and can't until clearance has been faxed. Please call pt.

## 2012-09-09 NOTE — Telephone Encounter (Signed)
I have reviewed the patient's echocardiogram. He is stable from a cardiovascular perspective. He does have severe left ventricular hypertrophy noted and he has known hypertensive heart disease. Considering his lack of symptoms and normal left ventricular systolic function, I think he can proceed with surgery without further cardiac testing. It will be important that he remain well hydrated and that he avoid hypotension throughout the perioperative period. I would be happy to see him if any problems arise.  Tonny Bollman 09/09/2012 5:13 PM

## 2012-09-09 NOTE — Telephone Encounter (Signed)
Dr Excell Seltzer did attempt to reach the pt by phone.  The pt is a resident at [facility] house and the person who answered the phone said Mr Morain is not staying there anymore. We will forward clearance to Dr Jearld Fenton.

## 2012-09-10 ENCOUNTER — Emergency Department (INDEPENDENT_AMBULATORY_CARE_PROVIDER_SITE_OTHER)
Admission: EM | Admit: 2012-09-10 | Discharge: 2012-09-10 | Disposition: A | Discharge status: Home / Self Care | Payer: Medicare Other | Source: Home / Self Care | Attending: Emergency Medicine | Admitting: Emergency Medicine

## 2012-09-10 ENCOUNTER — Encounter (HOSPITAL_COMMUNITY): Payer: Self-pay | Admitting: Emergency Medicine

## 2012-09-10 ENCOUNTER — Telehealth: Payer: Self-pay | Admitting: Cardiovascular Disease

## 2012-09-10 DIAGNOSIS — I10 Essential (primary) hypertension: Secondary | ICD-10-CM

## 2012-09-10 DIAGNOSIS — J381 Polyp of vocal cord and larynx: Secondary | ICD-10-CM

## 2012-09-10 DIAGNOSIS — R7309 Other abnormal glucose: Secondary | ICD-10-CM

## 2012-09-10 DIAGNOSIS — I2510 Atherosclerotic heart disease of native coronary artery without angina pectoris: Secondary | ICD-10-CM

## 2012-09-10 DIAGNOSIS — N179 Acute kidney failure, unspecified: Secondary | ICD-10-CM

## 2012-09-10 DIAGNOSIS — Z01810 Encounter for preprocedural cardiovascular examination: Secondary | ICD-10-CM

## 2012-09-10 DIAGNOSIS — E1165 Type 2 diabetes mellitus with hyperglycemia: Secondary | ICD-10-CM

## 2012-09-10 DIAGNOSIS — Z86711 Personal history of pulmonary embolism: Secondary | ICD-10-CM

## 2012-09-10 DIAGNOSIS — B353 Tinea pedis: Secondary | ICD-10-CM

## 2012-09-10 DIAGNOSIS — F329 Major depressive disorder, single episode, unspecified: Secondary | ICD-10-CM

## 2012-09-10 LAB — GLUCOSE, CAPILLARY: Glucose-Capillary: 106 mg/dL — ABNORMAL HIGH (ref 70–99)

## 2012-09-10 MED ORDER — ASPIRIN 325 MG PO TABS: 325.0000 mg | ORAL_TABLET | Freq: Every day | ORAL | Status: DC

## 2012-09-10 MED ORDER — AMLODIPINE BESYLATE 10 MG PO TABS: 10.0000 mg | ORAL_TABLET | Freq: Every day | ORAL | Status: AC

## 2012-09-10 MED ORDER — ESCITALOPRAM OXALATE 10 MG PO TABS: 10.0000 mg | ORAL_TABLET | Freq: Every day | ORAL | Status: AC

## 2012-09-10 MED ORDER — ACETAMINOPHEN 325 MG PO TABS: 650.0000 mg | ORAL_TABLET | Freq: Four times a day (QID) | ORAL | Status: AC | PRN

## 2012-09-10 MED ORDER — METFORMIN HCL 1000 MG PO TABS: 1000.0000 mg | ORAL_TABLET | Freq: Two times a day (BID) | ORAL | Status: AC

## 2012-09-10 MED ORDER — TRAZODONE HCL 150 MG PO TABS: 150.0000 mg | ORAL_TABLET | Freq: Every day | ORAL | Status: DC

## 2012-09-10 NOTE — ED Notes (Signed)
Pt is here to have meds refilled Seen today by a Nurse at [facility] Needing refills on Acetaminophen 325mg , Escitalopram 10mg , Metformin 1000mg , Amlodipine 5mg  Denies any new medical prob No PCP  He is alert and oriented w/no signs of acute distress.

## 2012-09-10 NOTE — ED Provider Notes (Signed)
Chief Complaint:   Chief Complaint  Patient presents with  . Medication Refill    History of Present Illness:   Nicholas Farrell is a 67 year old male who presents today for refills on medications. He has had chronic hoarseness and been diagnosed as having vocal cord polyp. He enjoys singing, so this is very distressing to him. Dr. Jearld Fenton plans to do surgery on this, but sent him to Dr. Tonny Bollman for cardiac clearance. He had a myocardial infarction in 2011 in [place]. He had 2 stents put in. His cardiac evaluation is currently being accomplished, but he was recently seen by a community nurse who identified the need for refills on several medications. He has had diabetes for the past 5 years. He's been on metformin 1000 mg twice a day with no medication side effects. He has not check his CBGs. He denies polyuria, polydipsia, rapid weight loss, or change in his vision. He's had no hypoglycemic episodes. He denies visual problems, shortness of breath, chest pain, tightness, pressure, syncope, or swelling of the legs, numbness, tingling, or ulcerations. He also has a history of depression and anxiety. He is on Lexapro 10 mg per day. He has a history of alcohol and drug abuse. His MI occurred in the setting of alcohol and cocaine intoxication. He had been staying at [facility] house but now is living on his own. He also has high blood pressure for years. He's been on amlodipine 5 mg a day and Dr. Excell Seltzer added lisinopril 20 mg a day. Even with both these medications his blood pressure still high today. He denies any medication side effects. He takes aspirin 325 mg a day for cardiac prevention. Also takes trazodone 150 mg at bedtime for sleep. He also has acetaminophen that he takes for pain and needs a refill on this as well.  Review of Systems:  Other than noted above, the patient denies any of the following symptoms. Systemic:  No fever, chills, fatigue, weight loss or gain. Eye:  No blurred vision or  diplopia. Lungs:  No cough, wheezing, or shortness of breath. Heart:  No chest pain, tightness, pressure, palpitation, dizziness, syncope, or edema. Abdomen:  No abdominal pain, nausea, vomiting or diarrrhea. GU:  No dysuria, frequency, urgency, hematuria. Ext:  No pain, paresthesias, swelling, or ulcerations. Endocrine:  No polyuria, polydipsia, heat or cold intolerance. Skin:  No rash or itching. Neuro:  No focal weakness or numbness.  PMFSH:  Past medical history, family history, social history, meds, and allergies were reviewed.   Physical Exam:   Vital signs:  BP 176/84  Pulse 72  Temp(Src) 98.3 F (36.8 C) (Oral)  Resp 18  SpO2 100% Gen:  Alert, oriented, in no distress. Eye:  PERRL, full EOM, lids conjunctivas, and sclera unremarkable. ENT:  TMs and canals normal.  Mucous membranes moist.  No acetone odor.  Pharynx clear.   Neck:  Supple, full ROM, no adenopathy or tenderness.  No JVD. Lungs:  Clear to auscultation.  No wheezes, rales or rhonchi. Heart:  Regular rhythm.  No gallops or murmers. Abdomen:  Soft, flat, non-distended, nontener.  No hepato-splenomegaly or mass.  Bowel sounds normal.  No pulsatile midline mass or bruit. Ext:  No edema, pulses full.  No ulceration or skin lesions. Skin:  Clear, warm and dry.  No rash or lesions. Neuro:  Alert and oriented times 3.  No focal weakness.  Speech normal.  CNs intact.  Labs:   Results for orders placed during the hospital encounter of  09/10/12  GLUCOSE, CAPILLARY      Result Value Range   Glucose-Capillary 106 (*) 70 - 99 mg/dL    Assessment:  The primary encounter diagnosis was Diabetes mellitus type 2, uncontrolled. Diagnoses of Hypertension, Coronary artery disease, Vocal cord polyp, and Depression were also pertinent to this visit.  Plan:   1.  The following meds were prescribed:   New Prescriptions   ACETAMINOPHEN (TYLENOL) 325 MG TABLET    Take 2 tablets (650 mg total) by mouth every 6 (six) hours as needed  for pain.   AMLODIPINE (NORVASC) 10 MG TABLET    Take 1 tablet (10 mg total) by mouth daily.   ASPIRIN 325 MG TABLET    Take 1 tablet (325 mg total) by mouth daily.   ESCITALOPRAM (LEXAPRO) 10 MG TABLET    Take 1 tablet (10 mg total) by mouth daily.   METFORMIN (GLUCOPHAGE) 1000 MG TABLET    Take 1 tablet (1,000 mg total) by mouth 2 (two) times daily.   TRAZODONE (DESYREL) 150 MG TABLET    Take 1 tablet (150 mg total) by mouth at bedtime.   2.  The patient was instructed in symptomatic care and handouts were given. 3.  The patient was told to return if becoming worse in any way, if no better in 3 or 4 days, and given some red flag symptoms such as difficulty breathing or chest pain that would indicate earlier return.  Follow up:  The patient was told to follow up with the adult care clinic once he is able to get an orange card.     Reuben Likes, MD 09/10/12 [phone]

## 2012-09-10 NOTE — ED Notes (Signed)
Pt given the Adult Clinic info; Talking to Arna Medici to set an appt up to est care w/them.

## 2012-09-10 NOTE — Telephone Encounter (Signed)
Cardiac Clearance Faxed to Dr.John Jearld Fenton [phone] 09/10/12/KM

## 2012-09-15 ENCOUNTER — Other Ambulatory Visit: Payer: Self-pay | Admitting: Otolaryngology

## 2012-09-16 ENCOUNTER — Telehealth: Payer: Self-pay | Admitting: Cardiovascular Disease

## 2012-09-16 ENCOUNTER — Encounter (HOSPITAL_COMMUNITY): Payer: Self-pay | Admitting: Pharmacy Technician

## 2012-09-16 NOTE — Telephone Encounter (Signed)
Records rec From [facility] gave to [facility] 09/16/12/KM

## 2012-09-17 ENCOUNTER — Encounter (HOSPITAL_COMMUNITY)
Admission: RE | Admit: 2012-09-17 | Discharge: 2012-09-17 | Disposition: A | Discharge status: Home / Self Care | Payer: Medicare Other | Source: Ambulatory Visit | Attending: Otolaryngology | Admitting: Otolaryngology

## 2012-09-17 ENCOUNTER — Encounter (HOSPITAL_COMMUNITY): Payer: Self-pay

## 2012-09-17 HISTORY — DX: Unspecified convulsions: R56.9

## 2012-09-17 HISTORY — DX: Cerebral infarction, unspecified: I63.9

## 2012-09-17 LAB — SURGICAL PCR SCREEN
MRSA, PCR: NEGATIVE
Staphylococcus aureus: NEGATIVE

## 2012-09-17 LAB — CBC
HCT: 42.1 % (ref 39.0–52.0)
Hemoglobin: 14.7 g/dL (ref 13.0–17.0)
MCHC: 34.9 g/dL (ref 30.0–36.0)
RDW: 13.4 % (ref 11.5–15.5)
WBC: 6.6 10*3/uL (ref 4.0–10.5)

## 2012-09-17 LAB — BASIC METABOLIC PANEL
BUN: 9 mg/dL (ref 6–23)
Chloride: 103 mEq/L (ref 96–112)
Creatinine, Ser: 0.97 mg/dL (ref 0.50–1.35)
GFR calc Af Amer: >90 mL/min (ref >90)
GFR calc non Af Amer: 85 mL/min — ABNORMAL LOW (ref >90)
Potassium: 3.9 mEq/L (ref 3.5–5.1)

## 2012-09-17 NOTE — Pre-Procedure Instructions (Signed)
Nicholas Farrell  09/17/2012   Your procedure is scheduled on:  Thursday September 18, 2012  Report to [facility] at 0645 AM.  Call this number if you have problems the morning of surgery: [phone]   Remember:   Do not eat food or drink liquids after midnight.Wednesday   Take these medicines the morning of surgery with A SIP OF WATER: Amolodipine   Do not wear jewelry.  Do not wear lotions, powders, or perfumes. You may wear deodorant.   Men may shave face and neck.  Do not bring valuables to the hospital.  Contacts, dentures or bridgework may not be worn into surgery.  Leave suitcase in the car. After surgery it may be brought to your room.  For patients admitted to the hospital, checkout time is 11:00 AM the day of  discharge.   Patients discharged the day of surgery will not be allowed to drive  home.    Special Instructions: Shower using CHG 2 nights before surgery and the night before surgery.  If you shower the day of surgery use CHG.  Use special wash - you have one bottle of CHG for all showers.  You should use approximately 1/3 of the bottle for each shower.   Please read over the following fact sheets that you were given: MRSA Information and Surgical Site Infection Prevention

## 2012-09-18 ENCOUNTER — Ambulatory Visit (HOSPITAL_COMMUNITY): Payer: Medicare Other | Admitting: Anesthesiology

## 2012-09-18 ENCOUNTER — Encounter (HOSPITAL_COMMUNITY): Payer: Self-pay | Admitting: *Deleted

## 2012-09-18 ENCOUNTER — Encounter (HOSPITAL_COMMUNITY)
Admission: RE | Disposition: A | Discharge status: Home / Self Care | Payer: Self-pay | Source: Ambulatory Visit | Attending: Otolaryngology

## 2012-09-18 ENCOUNTER — Observation Stay (HOSPITAL_COMMUNITY)
Admission: RE | Admit: 2012-09-18 | Discharge: 2012-09-19 | Disposition: A | Discharge status: Home / Self Care | Payer: Medicare Other | Source: Ambulatory Visit | Attending: Otolaryngology | Admitting: Otolaryngology

## 2012-09-18 ENCOUNTER — Encounter (HOSPITAL_COMMUNITY): Payer: Self-pay | Admitting: Anesthesiology

## 2012-09-18 DIAGNOSIS — C320 Malignant neoplasm of glottis: Principal | ICD-10-CM | POA: Insufficient documentation

## 2012-09-18 DIAGNOSIS — E119 Type 2 diabetes mellitus without complications: Secondary | ICD-10-CM | POA: Insufficient documentation

## 2012-09-18 DIAGNOSIS — J387 Other diseases of larynx: Secondary | ICD-10-CM

## 2012-09-18 DIAGNOSIS — R490 Dysphonia: Secondary | ICD-10-CM | POA: Insufficient documentation

## 2012-09-18 DIAGNOSIS — I2510 Atherosclerotic heart disease of native coronary artery without angina pectoris: Secondary | ICD-10-CM | POA: Insufficient documentation

## 2012-09-18 DIAGNOSIS — I10 Essential (primary) hypertension: Secondary | ICD-10-CM | POA: Insufficient documentation

## 2012-09-18 DIAGNOSIS — Z01812 Encounter for preprocedural laboratory examination: Secondary | ICD-10-CM | POA: Insufficient documentation

## 2012-09-18 HISTORY — DX: Acute myocardial infarction, unspecified: I21.9

## 2012-09-18 HISTORY — DX: Major depressive disorder, single episode, unspecified: F32.9

## 2012-09-18 HISTORY — DX: Depression, unspecified: F32.A

## 2012-09-18 HISTORY — DX: Unspecified osteoarthritis, unspecified site: M19.90

## 2012-09-18 HISTORY — PX: MICROLARYNGOSCOPY: SHX5208

## 2012-09-18 LAB — GLUCOSE, CAPILLARY
Glucose-Capillary: 107 mg/dL — ABNORMAL HIGH (ref 70–99)
Glucose-Capillary: 107 mg/dL — ABNORMAL HIGH (ref 70–99)

## 2012-09-18 SURGERY — MICROLARYNGOSCOPY
Anesthesia: General | Site: Mouth | Laterality: Left | Wound class: Clean Contaminated

## 2012-09-18 MED ORDER — METFORMIN HCL 500 MG PO TABS
1000.0000 mg | ORAL_TABLET | Freq: Two times a day (BID) | ORAL | Status: DC
Start: 2012-09-18 — End: 2012-09-19
  Administered 2012-09-18 – 2012-09-19 (×2): 1000 mg via ORAL
  Filled 2012-09-18 (×3): qty 2

## 2012-09-18 MED ORDER — ASPIRIN 325 MG PO TABS
325.0000 mg | ORAL_TABLET | Freq: Every day | ORAL | Status: DC
  Administered 2012-09-18 – 2012-09-19 (×2): 325 mg via ORAL
  Filled 2012-09-18 (×2): qty 1

## 2012-09-18 MED ORDER — TRAZODONE HCL 50 MG PO TABS
100.0000 mg | ORAL_TABLET | Freq: Every evening | ORAL | Status: DC | PRN
  Administered 2012-09-18: 100 mg via ORAL
  Filled 2012-09-18: qty 2

## 2012-09-18 MED ORDER — CITALOPRAM HYDROBROMIDE 20 MG PO TABS: 20.0000 mg | ORAL_TABLET | Freq: Every day | ORAL | Status: DC

## 2012-09-18 MED ORDER — MINERAL OIL LIGHT 100 % EX OIL
TOPICAL_OIL | CUTANEOUS | Status: AC
  Filled 2012-09-18: qty 25

## 2012-09-18 MED ORDER — OXYCODONE HCL 5 MG/5ML PO SOLN: 5.0000 mg | Freq: Once | ORAL | Status: DC | PRN

## 2012-09-18 MED ORDER — GLUCOSE BLOOD VI STRP: 1.0000 | ORAL_STRIP | Status: DC | PRN

## 2012-09-18 MED ORDER — AMLODIPINE BESYLATE 10 MG PO TABS
10.0000 mg | ORAL_TABLET | Freq: Every day | ORAL | Status: DC
  Administered 2012-09-18 – 2012-09-19 (×2): 10 mg via ORAL
  Filled 2012-09-18 (×2): qty 1

## 2012-09-18 MED ORDER — FENTANYL CITRATE 0.05 MG/ML IJ SOLN
INTRAMUSCULAR | Status: AC
  Administered 2012-09-18: 25 ug via INTRAVENOUS
  Filled 2012-09-18: qty 2

## 2012-09-18 MED ORDER — LIDOCAINE HCL (CARDIAC) 20 MG/ML IV SOLN
INTRAVENOUS | Status: DC | PRN
  Administered 2012-09-18: 80 mg via INTRAVENOUS

## 2012-09-18 MED ORDER — POTASSIUM CHLORIDE ER 10 MEQ PO TBCR
10.0000 meq | EXTENDED_RELEASE_TABLET | Freq: Every day | ORAL | Status: DC
  Administered 2012-09-18 – 2012-09-19 (×2): 10 meq via ORAL
  Filled 2012-09-18 (×2): qty 1

## 2012-09-18 MED ORDER — SUCCINYLCHOLINE CHLORIDE 20 MG/ML IJ SOLN
INTRAMUSCULAR | Status: DC | PRN
  Administered 2012-09-18: 100 mg via INTRAVENOUS

## 2012-09-18 MED ORDER — LACTATED RINGERS IV SOLN
INTRAVENOUS | Status: DC
  Administered 2012-09-18: 08:00:00 via INTRAVENOUS

## 2012-09-18 MED ORDER — ESCITALOPRAM OXALATE 10 MG PO TABS
10.0000 mg | ORAL_TABLET | Freq: Every day | ORAL | Status: DC
  Administered 2012-09-18 – 2012-09-19 (×2): 10 mg via ORAL
  Filled 2012-09-18 (×2): qty 1

## 2012-09-18 MED ORDER — EPINEPHRINE HCL (NASAL) 0.1 % NA SOLN
NASAL | Status: AC
  Filled 2012-09-18: qty 30

## 2012-09-18 MED ORDER — ONDANSETRON HCL 4 MG/2ML IJ SOLN: 4.0000 mg | INTRAMUSCULAR | Status: DC | PRN

## 2012-09-18 MED ORDER — ONDANSETRON HCL 4 MG/2ML IJ SOLN
4.0000 mg | Freq: Four times a day (QID) | INTRAMUSCULAR | Status: DC | PRN
Start: 2012-09-18 — End: 2012-09-18

## 2012-09-18 MED ORDER — ONDANSETRON HCL 4 MG PO TABS: 4.0000 mg | ORAL_TABLET | ORAL | Status: DC | PRN

## 2012-09-18 MED ORDER — HYDROCODONE-ACETAMINOPHEN 5-325 MG PO TABS
1.0000 | ORAL_TABLET | ORAL | Status: DC | PRN
  Administered 2012-09-18: 1 via ORAL
  Filled 2012-09-18: qty 2

## 2012-09-18 MED ORDER — LISINOPRIL 20 MG PO TABS
20.0000 mg | ORAL_TABLET | Freq: Every day | ORAL | Status: DC
  Administered 2012-09-18 – 2012-09-19 (×2): 20 mg via ORAL
  Filled 2012-09-18 (×2): qty 1

## 2012-09-18 MED ORDER — 0.9 % SODIUM CHLORIDE (POUR BTL) OPTIME
TOPICAL | Status: DC | PRN
  Administered 2012-09-18: 1000 mL

## 2012-09-18 MED ORDER — PROPOFOL 10 MG/ML IV BOLUS
INTRAVENOUS | Status: DC | PRN
  Administered 2012-09-18: 200 mg via INTRAVENOUS
  Administered 2012-09-18: 60 mg via INTRAVENOUS

## 2012-09-18 MED ORDER — MINERAL OIL LIGHT 100 % EX OIL
TOPICAL_OIL | CUTANEOUS | Status: DC | PRN
  Administered 2012-09-18: 1 via TOPICAL

## 2012-09-18 MED ORDER — BACITRACIN ZINC 500 UNIT/GM EX OINT
1.0000 "application " | TOPICAL_OINTMENT | Freq: Three times a day (TID) | CUTANEOUS | Status: DC
  Administered 2012-09-19: 1 via TOPICAL
  Filled 2012-09-18: qty 15

## 2012-09-18 MED ORDER — EPINEPHRINE HCL (NASAL) 0.1 % NA SOLN
NASAL | Status: DC | PRN
  Administered 2012-09-18: 1 [drp] via NASAL

## 2012-09-18 MED ORDER — FENTANYL CITRATE 0.05 MG/ML IJ SOLN
INTRAMUSCULAR | Status: DC | PRN
  Administered 2012-09-18: 50 ug via INTRAVENOUS
  Administered 2012-09-18: 75 ug via INTRAVENOUS

## 2012-09-18 MED ORDER — DEXTROSE-NACL 5-0.45 % IV SOLN
INTRAVENOUS | Status: DC
  Administered 2012-09-18 – 2012-09-19 (×3): via INTRAVENOUS

## 2012-09-18 MED ORDER — OXYCODONE HCL 5 MG PO TABS: 5.0000 mg | ORAL_TABLET | Freq: Once | ORAL | Status: DC | PRN

## 2012-09-18 MED ORDER — PROPOFOL INFUSION 10 MG/ML OPTIME
INTRAVENOUS | Status: DC | PRN
  Administered 2012-09-18: 200 ug/kg/min via INTRAVENOUS

## 2012-09-18 MED ORDER — HYDROCODONE-ACETAMINOPHEN 5-325 MG PO TABS
ORAL_TABLET | ORAL | Status: AC
  Administered 2012-09-18: 1
  Filled 2012-09-18: qty 1

## 2012-09-18 MED ORDER — FENTANYL CITRATE 0.05 MG/ML IJ SOLN: 25.0000 ug | INTRAMUSCULAR | Status: DC | PRN

## 2012-09-18 SURGICAL SUPPLY — 22 items
CANISTER SUCTION 2500CC (MISCELLANEOUS) ×2 IMPLANT
CLOTH BEACON ORANGE TIMEOUT ST (SAFETY) ×2 IMPLANT
CONT SPEC 4OZ CLIKSEAL STRL BL (MISCELLANEOUS) ×1 IMPLANT
COVER TABLE BACK 60X90 (DRAPES) ×2 IMPLANT
CRADLE DONUT ADULT HEAD (MISCELLANEOUS) ×1 IMPLANT
DRAPE PROXIMA HALF (DRAPES) ×2 IMPLANT
GLOVE ECLIPSE 6.5 STRL STRAW (GLOVE) ×1 IMPLANT
GLOVE SS BIOGEL STRL SZ 7.5 (GLOVE) ×1 IMPLANT
GLOVE SUPERSENSE BIOGEL SZ 7.5 (GLOVE) ×1
GUARD TEETH (MISCELLANEOUS) IMPLANT
KIT ROOM TURNOVER OR (KITS) ×2 IMPLANT
MARKER SKIN DUAL TIP RULER LAB (MISCELLANEOUS) IMPLANT
NS IRRIG 1000ML POUR BTL (IV SOLUTION) ×2 IMPLANT
PAD ARMBOARD 7.5X6 YLW CONV (MISCELLANEOUS) ×4 IMPLANT
PATTIES SURGICAL .5 X1 (DISPOSABLE) ×1 IMPLANT
SPECIMEN JAR SMALL (MISCELLANEOUS) ×1 IMPLANT
SPONGE GAUZE 4X4 12PLY (GAUZE/BANDAGES/DRESSINGS) ×2 IMPLANT
TAPE SURG TRANSPORE 1 IN (GAUZE/BANDAGES/DRESSINGS) IMPLANT
TAPE SURGICAL TRANSPORE 1 IN (GAUZE/BANDAGES/DRESSINGS) ×1
TOWEL OR 17X24 6PK STRL BLUE (TOWEL DISPOSABLE) ×4 IMPLANT
TUBE CONNECTING 12X1/4 (SUCTIONS) ×2 IMPLANT
WATER STERILE IRR 1000ML POUR (IV SOLUTION) ×1 IMPLANT

## 2012-09-18 NOTE — Preoperative (Signed)
Beta Blockers   Reason not to administer Beta Blockers:Not Applicable 

## 2012-09-18 NOTE — Anesthesia Postprocedure Evaluation (Signed)
Anesthesia Post Note  Patient: Nicholas Farrell  Procedure(s) Performed: Procedure(s) (LRB): MICROLARYNGOSCOPY (Left)  Anesthesia type: General  Patient location: PACU  Post pain: Pain level controlled and Adequate analgesia  Post assessment: Post-op Vital signs reviewed, Patient's Cardiovascular Status Stable, Respiratory Function Stable, Patent Airway and Pain level controlled  Last Vitals:  Filed Vitals:   09/18/12 1000  BP: 147/73  Pulse: 74  Temp:   Resp: 26    Post vital signs: Reviewed and stable  Level of consciousness: awake, alert  and oriented  Complications: No apparent anesthesia complications

## 2012-09-18 NOTE — Transfer of Care (Signed)
Immediate Anesthesia Transfer of Care Note  Patient: Nicholas Farrell  Procedure(s) Performed: Procedure(s) with comments: MICROLARYNGOSCOPY (Left) - MICROLARYNGOSCOPY WITH REMOVAL OF VOCAL CORD POLYP  Patient Location: PACU  Anesthesia Type:General  Level of Consciousness: awake  Airway & Oxygen Therapy: Patient Spontanous Breathing and Patient connected to nasal cannula oxygen  Post-op Assessment: Report given to PACU RN, Post -op Vital signs reviewed and stable and Patient moving all extremities  Post vital signs: Reviewed and stable  Complications: No apparent anesthesia complications

## 2012-09-18 NOTE — Anesthesia Preprocedure Evaluation (Signed)
Anesthesia Evaluation  Patient identified by MRN, date of birth, ID band Patient awake    Reviewed: Allergy & Precautions, H&P , NPO status , Patient's Chart, lab work & pertinent test results  Airway Mallampati: II  Neck ROM: full    Dental   Pulmonary former smoker,          Cardiovascular hypertension, + CAD and + Cardiac Stents     Neuro/Psych Seizures -,  CVA    GI/Hepatic   Endo/Other  diabetes, Type 2obese  Renal/GU      Musculoskeletal   Abdominal   Peds  Hematology   Anesthesia Other Findings   Reproductive/Obstetrics                           Anesthesia Physical Anesthesia Plan  ASA: III  Anesthesia Plan: General   Post-op Pain Management:    Induction: Intravenous  Airway Management Planned: Oral ETT  Additional Equipment:   Intra-op Plan:   Post-operative Plan: Extubation in OR  Informed Consent: I have reviewed the patients History and Physical, chart, labs and discussed the procedure including the risks, benefits and alternatives for the proposed anesthesia with the patient or authorized representative who has indicated his/her understanding and acceptance.     Plan Discussed with: CRNA, Anesthesiologist and Surgeon  Anesthesia Plan Comments:         Anesthesia Quick Evaluation

## 2012-09-18 NOTE — Op Note (Signed)
Preop/postop diagnosis left vocal cord lesion Procedure: Microlaryngoscopy with removal of left vocal cord mass Anesthesia: Gen. Estimated blood loss: Less than 5 cc Indications: 67 year old with a mass diagnosed on the left vocal cord by fiberoptic exam. He's had hoarseness. He was informed a risk and benefits of the procedure and options were discussed. All questions are answered and consent was obtained.  Operation patient was taken to the operating room and placed supine position after general endotracheal tube anesthesia was placed in the rose position. The Dedo scope was inserted and the larynx and hypopharynx was examined. There was no evidence of any lesions in the hypopharynx post cricoid or pharyngeal walls. The larynx was examined and there was a large irregular mass in the left vocal cord. The scope was suspended and the microscope was positioned. The left mass was grasped with the cup forceps and then dissected off the vocal cord with straight scissors. The muscle was left intact. The mass was sent for permanent section. The vocal cord fold looked straight after excision and the actual attachment area of the mass was off the vocal fold. Hemostasis was achieved with adrenaline soaked pledget. There was no lesion in the subglottis that was identified. The patient was then awakened brought to cover stable condition counts correct

## 2012-09-18 NOTE — H&P (Signed)
Nicholas Farrell is an 67 y.o. male.   Chief Complaint: hoarseness HPI: Hx of mass on larynx with hoarseness  Past Medical History  Diagnosis Date  . Diabetes mellitus   . Hypertension   . CAD (coronary artery disease)   . Diverticulosis   . Seizures     20 yrs ago, off Dilantin for 5 yrs  . Stroke 2011    no residual    Past Surgical History  Procedure Laterality Date  . Mouth surgery      cut bone out of upper and lower jaw to prepare for dentures  . Coronary angioplasty with stent placement      History reviewed. No pertinent family history. Social History:  reports that he quit smoking about a year ago. He does not have any smokeless tobacco history on file. He reports that he does not drink alcohol or use illicit drugs.  Allergies: No Known Allergies  Medications Prior to Admission  Medication Sig Dispense Refill  . acetaminophen (TYLENOL) 325 MG tablet Take 2 tablets (650 mg total) by mouth every 6 (six) hours as needed for pain.  240 tablet  5  . amLODipine (NORVASC) 10 MG tablet Take 1 tablet (10 mg total) by mouth daily.  30 tablet  5  . aspirin 325 MG tablet Take 1 tablet (325 mg total) by mouth daily.  30 tablet  5  . citalopram (CELEXA) 20 MG tablet Take 20 mg by mouth daily.      Marland Kitchen escitalopram (LEXAPRO) 10 MG tablet Take 1 tablet (10 mg total) by mouth daily.  30 tablet  5  . glucose blood (ACCU-CHEK ACTIVE STRIPS) test strip Use as instructed  100 each  12  . glucose blood (ACCU-CHEK INSTANT GLUCOSE TEST) test strip Use as instructed  100 each  12  . lisinopril (PRINIVIL,ZESTRIL) 20 MG tablet Take 1 tablet (20 mg total) by mouth daily.  90 tablet  3  . metFORMIN (GLUCOPHAGE) 1000 MG tablet Take 1 tablet (1,000 mg total) by mouth 2 (two) times daily.  60 tablet  5  . potassium chloride (K-DUR) 10 MEQ tablet Take 1 tablet (10 mEq total) by mouth daily.  90 tablet  3  . traZODone (DESYREL) 100 MG tablet Take 100 mg by mouth at bedtime as needed for sleep.         Results for orders placed during the hospital encounter of 09/18/12 (from the past 48 hour(s))  GLUCOSE, CAPILLARY     Status: Abnormal   Collection Time    09/18/12  6:53 AM      Result Value Range   Glucose-Capillary 107 (*) 70 - 99 mg/dL  GLUCOSE, CAPILLARY     Status: None   Collection Time    09/18/12  8:14 AM      Result Value Range   Glucose-Capillary 99  70 - 99 mg/dL   No results found.  Review of Systems  Constitutional: Negative.   HENT: Negative.   Eyes: Negative.   Respiratory: Negative.   Cardiovascular: Negative.   Skin: Negative.     Blood pressure 150/78, pulse 65, temperature 98.1 F (36.7 C), temperature source Oral, resp. rate 18, SpO2 100.00%. Physical Exam  Constitutional: He appears well-nourished.  HENT:  Nose: Nose normal.  Mouth/Throat: Oropharynx is clear and moist.  Eyes: Pupils are equal, round, and reactive to light.  Neck: Normal range of motion.  Cardiovascular: Normal rate.   Respiratory: Effort normal.  GI: Soft.     Assessment/Plan  Laryngeal mass- need microlaryngoscopy and discussed procedure  Nitza Schmid 09/18/2012, 9:00 AM

## 2012-09-19 ENCOUNTER — Encounter (HOSPITAL_COMMUNITY): Payer: Self-pay | Admitting: Otolaryngology

## 2012-09-19 NOTE — Clinical Social Work Psychosocial (Signed)
     Clinical Social Work Department BRIEF PSYCHOSOCIAL ASSESSMENT 09/19/2012  Patient:  Nicholas Farrell, Nicholas Farrell     Account Number:  [id number]     Admit date:  09/18/2012  Clinical Social Worker:  Hulan Fray  Date/Time:  09/19/2012 10:04 AM  Referred by:  RN  Date Referred:  09/18/2012 Referred for  Homelessness   Other Referral:   Interview type:  Patient Other interview type:    PSYCHOSOCIAL DATA Living Status:  ALONE Admitted from facility:  [facility] Level of care:  Independent Living Primary support name:  none listed Primary support relationship to patient:  NONE Degree of support available:    CURRENT CONCERNS Current Concerns  Other - See comment   Other Concerns:   Assistance with transportation back to [facility]    SOCIAL WORK ASSESSMENT / PLAN Clinical Social Worker received referral for patient being homeless. CSW introduced self and explained reason for visit. Patient reported that he is staying at [facility] and plans to return at discharge. Patient reported that he will stay at [facility] until June and then transition to [place] to sing at [facility]. Patient reported that he has church members in [place] that are a support for him. Patient reported that he will need assistance with transportation and CSW will provide bus pass. CSW will sign off, as social work intervention is no longer needed.   Assessment/plan status:  No Further Intervention Required Other assessment/ plan:   Information/referral to community resources:   [employer] provided    PATIENTS/FAMILYS RESPONSE TO PLAN OF CARE: Patient reported that plan is to return back to [employer] at discharge  and was appreciative of bus pass provided by CSW.

## 2012-09-19 NOTE — Progress Notes (Signed)
1 Day Post-Op  Subjective: Doing well. Voice already better. No bleeding or breathing problem. Taking fluids well.  Objective: Vital signs in last 24 hours: Temp:  [97.7 F (36.5 C)-99.3 F (37.4 C)] 98.4 F (36.9 C) (04/25 0501) Pulse Rate:  [49-74] 69 (04/25 0501) Resp:  [15-26] 16 (04/25 0501) BP: (125-165)/(45-89) 139/72 mmHg (04/25 0501) SpO2:  [98 %-100 %] 98 % (04/25 0501) Weight:  [94.802 kg (209 lb)] 94.802 kg (209 lb) (04/24 1222) Last BM Date: 09/18/12  Intake/Output from previous day: 04/24 0701 - 04/25 0700 In: 3109 [I.V.:3109] Out: 2175 [Urine:2175] Intake/Output this shift:    no complaints. voice great. no bleeding. no stridor. neck- no swelling or mass lungs- clear heart- regular ext- no swelling or pain  Lab Results:   Recent Labs  09/17/12 1236  WBC 6.6  HGB 14.7  HCT 42.1  PLT 215   BMET  Recent Labs  09/17/12 1236  NA 139  K 3.9  CL 103  CO2 26  GLUCOSE 68*  BUN 9  CREATININE 0.97  CALCIUM 9.8   PT/INR No results found for this basename: LABPROT, INR,  in the last 72 hours ABG No results found for this basename: PHART, PCO2, PO2, HCO3,  in the last 72 hours  Studies/Results: No results found.  Anti-infectives: Anti-infectives   None      Assessment/Plan: s/p Procedure(s) with comments: MICROLARYNGOSCOPY (Left) - MICROLARYNGOSCOPY WITH REMOVAL OF VOCAL CORD POLYP d/c to home f/u in 1-2 weeks  LOS: 1 day    Suzanna Obey 09/19/2012

## 2012-09-19 NOTE — Progress Notes (Signed)
Patient discharged to home with instructions, verbalized understanding. 

## 2012-09-19 NOTE — Discharge Summary (Signed)
  Admit dx: laryngeal mass discharge: same Procedure. Microlaryngoscopy  Patient admitted for social reason with no family or transportation. He is doing well. He tolerated microlaryngoscopy. He is taking fluids with no breathing problem or bleeding. No pain.  F/u in 1-2 weeks.

## 2012-10-15 ENCOUNTER — Other Ambulatory Visit (HOSPITAL_COMMUNITY): Payer: Self-pay | Admitting: Otolaryngology

## 2012-10-15 DIAGNOSIS — IMO0002 Reserved for concepts with insufficient information to code with codable children: Secondary | ICD-10-CM

## 2012-10-23 ENCOUNTER — Encounter (HOSPITAL_COMMUNITY)
Admission: RE | Admit: 2012-10-23 | Discharge: 2012-10-23 | Disposition: A | Discharge status: Home / Self Care | Payer: Medicare Other | Source: Ambulatory Visit | Attending: Otolaryngology | Admitting: Otolaryngology

## 2012-10-23 ENCOUNTER — Other Ambulatory Visit (HOSPITAL_COMMUNITY): Payer: Self-pay | Admitting: Otolaryngology

## 2012-10-23 ENCOUNTER — Ambulatory Visit (HOSPITAL_COMMUNITY)
Admission: RE | Admit: 2012-10-23 | Discharge: 2012-10-23 | Disposition: A | Discharge status: Home / Self Care | Payer: Medicare Other | Source: Ambulatory Visit | Attending: Otolaryngology | Admitting: Otolaryngology

## 2012-10-23 ENCOUNTER — Encounter (HOSPITAL_COMMUNITY): Payer: Self-pay

## 2012-10-23 DIAGNOSIS — IMO0002 Reserved for concepts with insufficient information to code with codable children: Secondary | ICD-10-CM

## 2012-10-23 DIAGNOSIS — J351 Hypertrophy of tonsils: Secondary | ICD-10-CM | POA: Insufficient documentation

## 2012-10-23 DIAGNOSIS — C320 Malignant neoplasm of glottis: Secondary | ICD-10-CM | POA: Insufficient documentation

## 2012-10-23 DIAGNOSIS — E119 Type 2 diabetes mellitus without complications: Secondary | ICD-10-CM | POA: Insufficient documentation

## 2012-10-23 DIAGNOSIS — Z87891 Personal history of nicotine dependence: Secondary | ICD-10-CM | POA: Insufficient documentation

## 2012-10-23 HISTORY — DX: Malignant (primary) neoplasm, unspecified: C80.1

## 2012-10-23 LAB — GLUCOSE, CAPILLARY: Glucose-Capillary: 111 mg/dL — ABNORMAL HIGH (ref 70–99)

## 2012-10-23 MED ORDER — FLUDEOXYGLUCOSE F - 18 (FDG) INJECTION
19.7000 | Freq: Once | INTRAVENOUS | Status: AC | PRN
  Administered 2012-10-23: 19.7 via INTRAVENOUS

## 2012-10-23 MED ORDER — IOHEXOL 300 MG/ML  SOLN
100.0000 mL | Freq: Once | INTRAMUSCULAR | Status: AC | PRN
  Administered 2012-10-23: 100 mL via INTRAVENOUS

## 2013-08-02 IMAGING — CT CT NECK W/ CM
4 of 5 series · 15 of 33 positions shown, 18 images · IV contrast (OMNIPAQUE)
Comparison: PET CT from today.

CLINICAL DATA: Squamous cell carcinoma of glottis.  Vocal cord
lesion resection.

CT NECK WITH CONTRAST
TECHNIQUE: Multidetector CT imaging of the neck was performed with
intravenous contrast.
Contrast: 100mL OMNIPAQUE IOHEXOL 300 MG/ML  SOLN

[Series 2: neck st · axial · 0.39mm/px · z∈[-244,-78]mm · 5 of 125 slices shown, 7 images]
[im 21/125  soft-tissue]
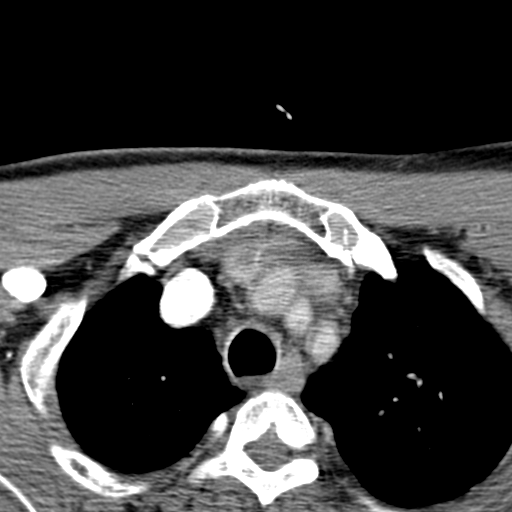
[im 21/125  bone]
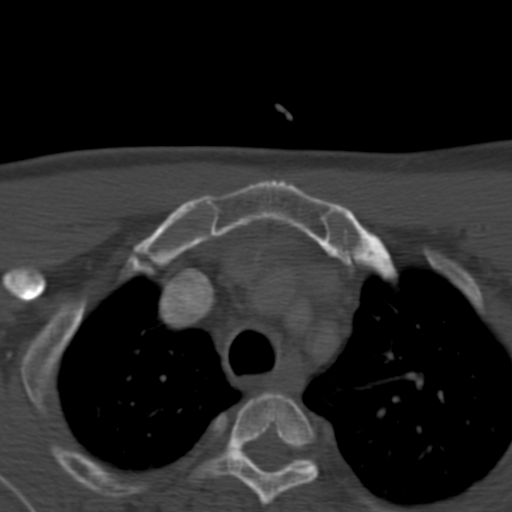
[im 42/125  bone]
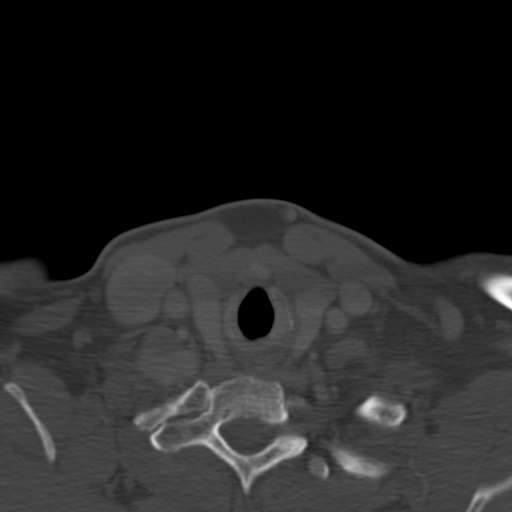
[im 63/125  bone]
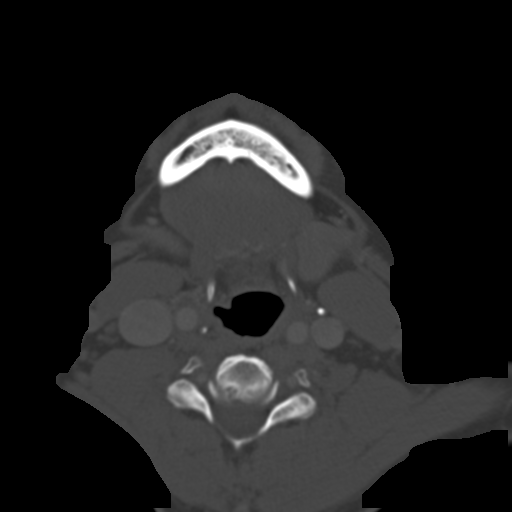
[im 83/125  bone]
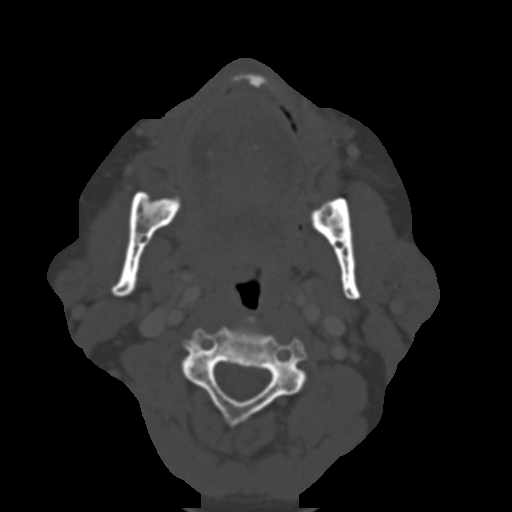
[im 104/125  soft-tissue]
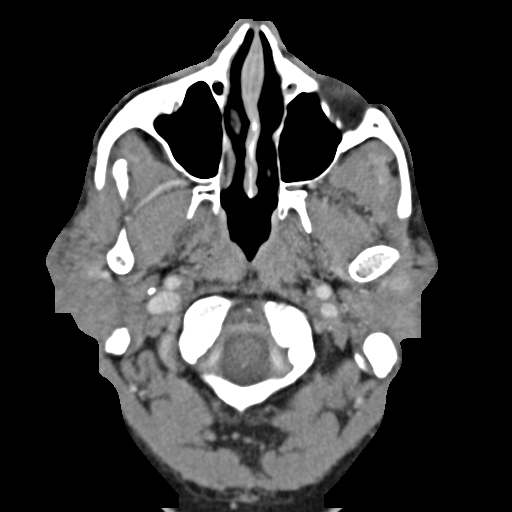
[im 104/125  bone]
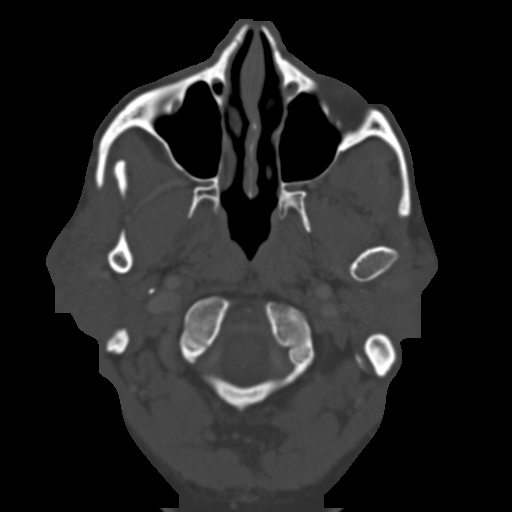

[Series 602: <mpr thick range> · coronal · 0.49mm/px · 3 of 64 slices shown]
[im 16/64  bone]
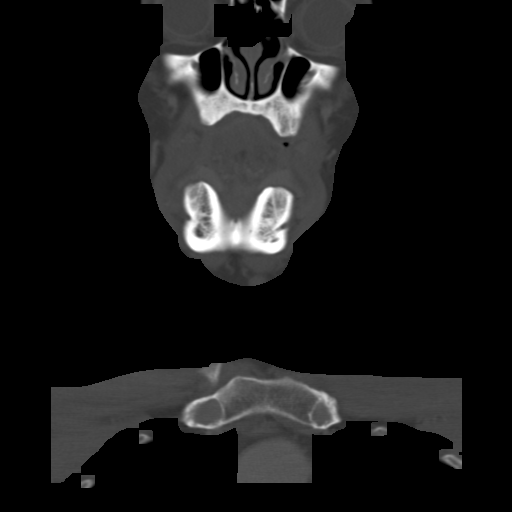
[im 27/64  bone]
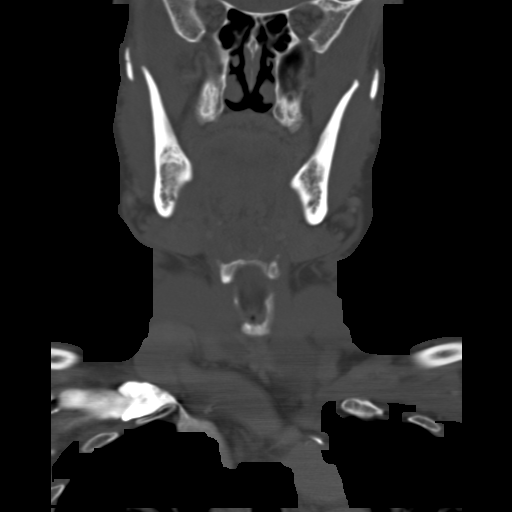
[im 38/64  bone]
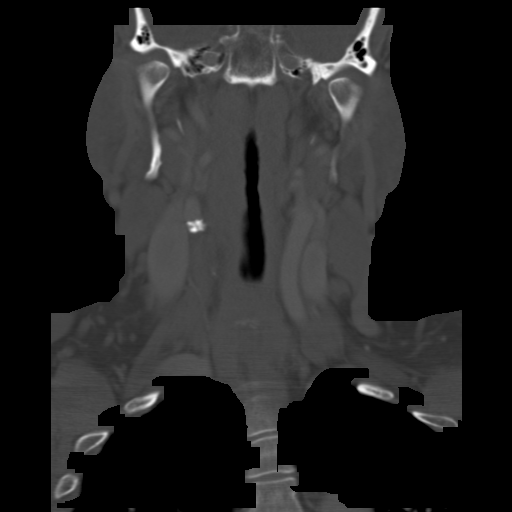

[Series 603: <mpr thick range(1)> · axial · 0.49mm/px · z∈[-249,-168]mm · 2 of 85 slices shown]
[im 29/85  bone]
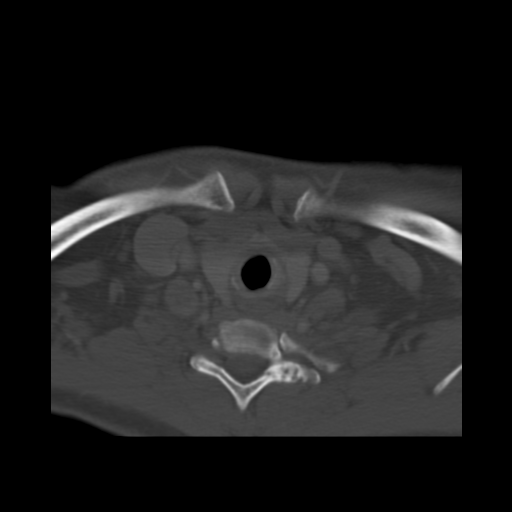
[im 57/85  bone]
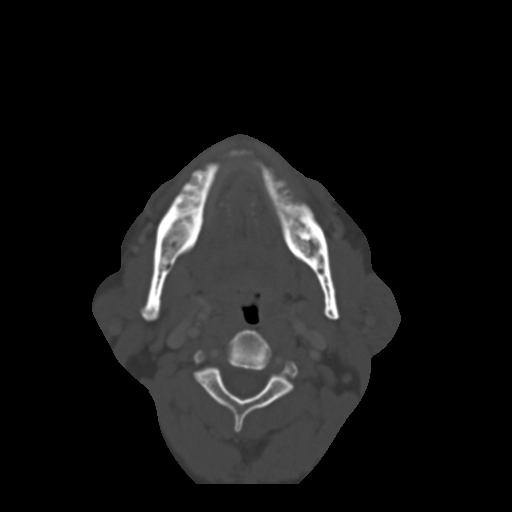

[Series 604: <mpr thick range(2)> · sagittal · 0.49mm/px · 5 of 60 slices shown, 6 images]
[im 20/60  bone]
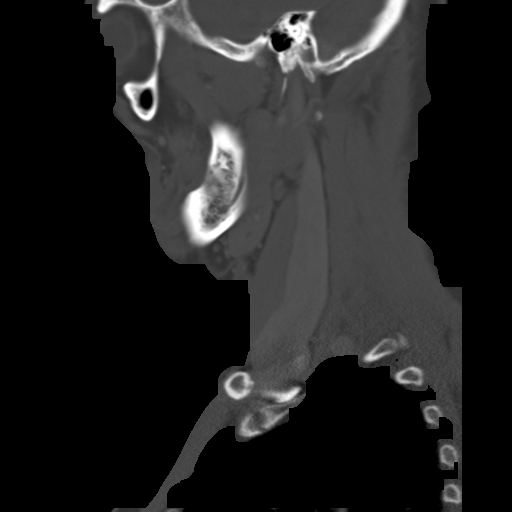
[im 25/60  bone]
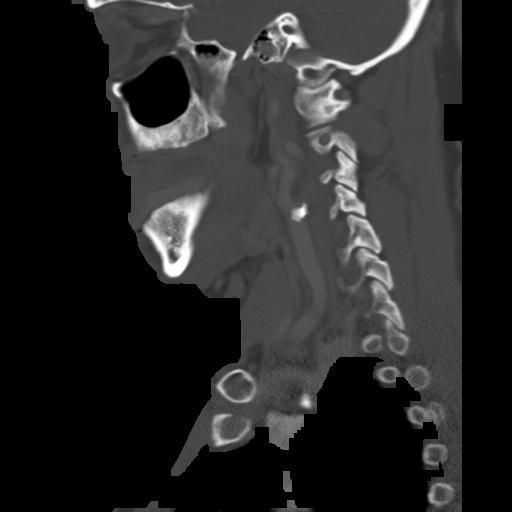
[im 30/60  soft-tissue]
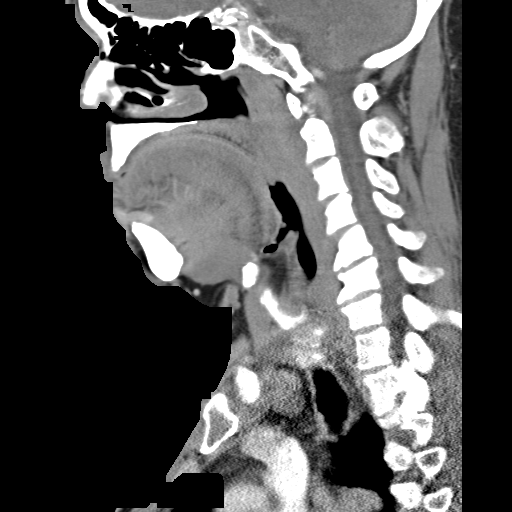
[im 30/60  bone]
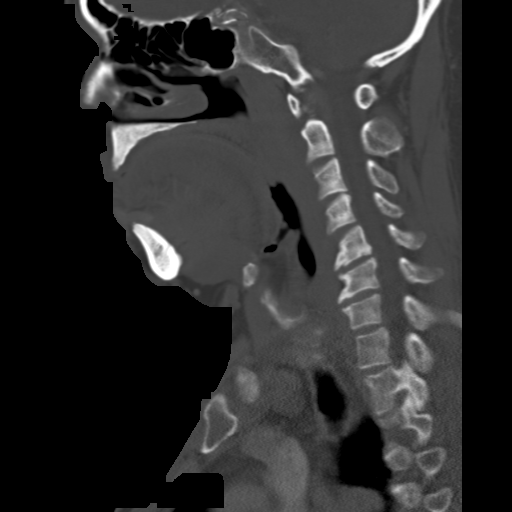
[im 35/60  bone]
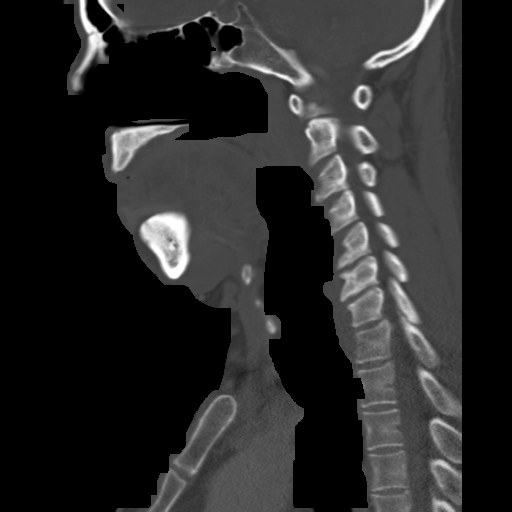
[im 40/60  bone]
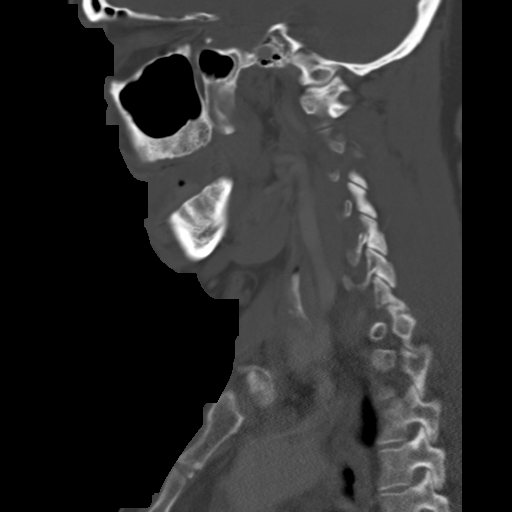

[15 of 33 positions shown; findings below may reference images not displayed]

FINDINGS: Vocal cords are symmetric and normal in size bilaterally.
No evidence of vocal cord paralysis.  Epiglottis is normal.  No
supraglottic tumor.

Visualized intracranial contents are normal.  Paranasal sinuses are
clear.  Hypertrophy of the tonsils bilaterally which is symmetric
and without mass lesion.

Parotid and submandibular glands are normal and symmetric
bilaterally.  No pathologic adenopathy in the neck.  Thyroid is
normal.  Lung apices are clear of lung nodules.
IMPRESSION: The vocal cords appear symmetric and normal in size.  Negative for
cervical adenopathy.

## 2015-03-04 ENCOUNTER — Observation Stay (HOSPITAL_COMMUNITY): Payer: Medicare Other

## 2015-03-04 ENCOUNTER — Encounter (HOSPITAL_COMMUNITY): Payer: Self-pay | Admitting: *Deleted

## 2015-03-04 ENCOUNTER — Emergency Department (HOSPITAL_COMMUNITY): Payer: Medicare Other

## 2015-03-04 ENCOUNTER — Observation Stay (HOSPITAL_BASED_OUTPATIENT_CLINIC_OR_DEPARTMENT_OTHER): Payer: Medicare Other

## 2015-03-04 ENCOUNTER — Observation Stay (HOSPITAL_COMMUNITY)
Admission: EM | Admit: 2015-03-04 | Discharge: 2015-03-08 | Disposition: A | Discharge status: Home / Self Care | Payer: Medicare Other | Attending: Internal Medicine | Admitting: Internal Medicine

## 2015-03-04 DIAGNOSIS — M1990 Unspecified osteoarthritis, unspecified site: Secondary | ICD-10-CM | POA: Insufficient documentation

## 2015-03-04 DIAGNOSIS — Z8673 Personal history of transient ischemic attack (TIA), and cerebral infarction without residual deficits: Secondary | ICD-10-CM | POA: Diagnosis not present

## 2015-03-04 DIAGNOSIS — Z85818 Personal history of malignant neoplasm of other sites of lip, oral cavity, and pharynx: Secondary | ICD-10-CM | POA: Diagnosis not present

## 2015-03-04 DIAGNOSIS — Z87891 Personal history of nicotine dependence: Secondary | ICD-10-CM | POA: Insufficient documentation

## 2015-03-04 DIAGNOSIS — E119 Type 2 diabetes mellitus without complications: Secondary | ICD-10-CM | POA: Diagnosis not present

## 2015-03-04 DIAGNOSIS — I2699 Other pulmonary embolism without acute cor pulmonale: Secondary | ICD-10-CM

## 2015-03-04 DIAGNOSIS — J159 Unspecified bacterial pneumonia: Secondary | ICD-10-CM | POA: Diagnosis not present

## 2015-03-04 DIAGNOSIS — I2510 Atherosclerotic heart disease of native coronary artery without angina pectoris: Secondary | ICD-10-CM | POA: Diagnosis not present

## 2015-03-04 DIAGNOSIS — Z7982 Long term (current) use of aspirin: Secondary | ICD-10-CM | POA: Diagnosis not present

## 2015-03-04 DIAGNOSIS — I5189 Other ill-defined heart diseases: Secondary | ICD-10-CM | POA: Diagnosis present

## 2015-03-04 DIAGNOSIS — F32A Depression, unspecified: Secondary | ICD-10-CM | POA: Diagnosis present

## 2015-03-04 DIAGNOSIS — F1910 Other psychoactive substance abuse, uncomplicated: Secondary | ICD-10-CM | POA: Diagnosis present

## 2015-03-04 DIAGNOSIS — I10 Essential (primary) hypertension: Secondary | ICD-10-CM | POA: Diagnosis not present

## 2015-03-04 DIAGNOSIS — Z8719 Personal history of other diseases of the digestive system: Secondary | ICD-10-CM | POA: Insufficient documentation

## 2015-03-04 DIAGNOSIS — F1020 Alcohol dependence, uncomplicated: Secondary | ICD-10-CM

## 2015-03-04 DIAGNOSIS — Z79899 Other long term (current) drug therapy: Secondary | ICD-10-CM | POA: Diagnosis not present

## 2015-03-04 DIAGNOSIS — F329 Major depressive disorder, single episode, unspecified: Secondary | ICD-10-CM | POA: Diagnosis present

## 2015-03-04 DIAGNOSIS — R079 Chest pain, unspecified: Secondary | ICD-10-CM | POA: Diagnosis present

## 2015-03-04 DIAGNOSIS — Z87898 Personal history of other specified conditions: Secondary | ICD-10-CM | POA: Insufficient documentation

## 2015-03-04 DIAGNOSIS — I252 Old myocardial infarction: Secondary | ICD-10-CM

## 2015-03-04 DIAGNOSIS — IMO0002 Reserved for concepts with insufficient information to code with codable children: Secondary | ICD-10-CM | POA: Diagnosis present

## 2015-03-04 DIAGNOSIS — Z7984 Long term (current) use of oral hypoglycemic drugs: Secondary | ICD-10-CM

## 2015-03-04 DIAGNOSIS — J189 Pneumonia, unspecified organism: Secondary | ICD-10-CM | POA: Insufficient documentation

## 2015-03-04 DIAGNOSIS — R0781 Pleurodynia: Secondary | ICD-10-CM | POA: Diagnosis present

## 2015-03-04 DIAGNOSIS — F1420 Cocaine dependence, uncomplicated: Secondary | ICD-10-CM

## 2015-03-04 DIAGNOSIS — C329 Malignant neoplasm of larynx, unspecified: Secondary | ICD-10-CM | POA: Insufficient documentation

## 2015-03-04 DIAGNOSIS — E785 Hyperlipidemia, unspecified: Secondary | ICD-10-CM | POA: Diagnosis present

## 2015-03-04 DIAGNOSIS — Z23 Encounter for immunization: Secondary | ICD-10-CM | POA: Insufficient documentation

## 2015-03-04 DIAGNOSIS — E1165 Type 2 diabetes mellitus with hyperglycemia: Secondary | ICD-10-CM | POA: Diagnosis present

## 2015-03-04 DIAGNOSIS — F17210 Nicotine dependence, cigarettes, uncomplicated: Secondary | ICD-10-CM

## 2015-03-04 LAB — LIPID PANEL
CHOLESTEROL: 148 mg/dL (ref 0–200)
HDL: 39 mg/dL — ABNORMAL LOW (ref >40)
LDL Cholesterol: 100 mg/dL — ABNORMAL HIGH (ref 0–99)
Total CHOL/HDL Ratio: 3.8 RATIO
Triglycerides: 45 mg/dL (ref <150)
VLDL: 9 mg/dL (ref 0–40)

## 2015-03-04 LAB — BASIC METABOLIC PANEL
Anion gap: 11 (ref 5–15)
BUN: 15 mg/dL (ref 6–20)
CHLORIDE: 97 mmol/L — AB (ref 101–111)
CO2: 26 mmol/L (ref 22–32)
CREATININE: 1.56 mg/dL — AB (ref 0.61–1.24)
Calcium: 9.2 mg/dL (ref 8.9–10.3)
GFR calc non Af Amer: 44 mL/min — ABNORMAL LOW (ref >60)
GFR, EST AFRICAN AMERICAN: 51 mL/min — AB (ref >60)
Glucose, Bld: 134 mg/dL — ABNORMAL HIGH (ref 65–99)
POTASSIUM: 4.2 mmol/L (ref 3.5–5.1)
SODIUM: 134 mmol/L — AB (ref 135–145)

## 2015-03-04 LAB — CBC WITH DIFFERENTIAL/PLATELET
Basophils Absolute: 0 10*3/uL (ref 0.0–0.1)
Basophils Relative: 0 %
EOS ABS: 0.1 10*3/uL (ref 0.0–0.7)
Eosinophils Relative: 1 %
HCT: 45.8 % (ref 39.0–52.0)
HEMOGLOBIN: 15.9 g/dL (ref 13.0–17.0)
LYMPHS ABS: 2.5 10*3/uL (ref 0.7–4.0)
LYMPHS PCT: 23 %
MCH: 32.3 pg (ref 26.0–34.0)
MCHC: 34.7 g/dL (ref 30.0–36.0)
MCV: 93.1 fL (ref 78.0–100.0)
MONOS PCT: 14 %
Monocytes Absolute: 1.5 10*3/uL — ABNORMAL HIGH (ref 0.1–1.0)
NEUTROS PCT: 62 %
Neutro Abs: 6.6 10*3/uL (ref 1.7–7.7)
Platelets: 183 10*3/uL (ref 150–400)
RBC: 4.92 MIL/uL (ref 4.22–5.81)
RDW: 13.6 % (ref 11.5–15.5)
WBC: 10.6 10*3/uL — AB (ref 4.0–10.5)

## 2015-03-04 LAB — CBG MONITORING, ED
Glucose-Capillary: 131 mg/dL — ABNORMAL HIGH (ref 65–99)
Glucose-Capillary: 126 mg/dL — ABNORMAL HIGH (ref 65–99)

## 2015-03-04 LAB — HEPATIC FUNCTION PANEL
ALBUMIN: 3.1 g/dL — AB (ref 3.5–5.0)
ALK PHOS: 50 U/L (ref 38–126)
ALT: 22 U/L (ref 17–63)
AST: 23 U/L (ref 15–41)
Bilirubin, Direct: 0.2 mg/dL (ref 0.1–0.5)
Indirect Bilirubin: 0.8 mg/dL (ref 0.3–0.9)
TOTAL PROTEIN: 7 g/dL (ref 6.5–8.1)
Total Bilirubin: 1 mg/dL (ref 0.3–1.2)

## 2015-03-04 LAB — HEPARIN LEVEL (UNFRACTIONATED): HEPARIN UNFRACTIONATED: 0.55 [IU]/mL (ref 0.30–0.70)

## 2015-03-04 LAB — PHOSPHORUS: PHOSPHORUS: 3.6 mg/dL (ref 2.5–4.6)

## 2015-03-04 LAB — GLUCOSE, CAPILLARY: Glucose-Capillary: 133 mg/dL — ABNORMAL HIGH (ref 65–99)

## 2015-03-04 LAB — MAGNESIUM: Magnesium: 1.9 mg/dL (ref 1.7–2.4)

## 2015-03-04 MED ORDER — VITAMIN B-1 100 MG PO TABS
100.0000 mg | ORAL_TABLET | Freq: Every day | ORAL | Status: DC
Start: 2015-03-04 — End: 2015-03-08
  Administered 2015-03-04 – 2015-03-08 (×5): 100 mg via ORAL
  Filled 2015-03-04 (×5): qty 1

## 2015-03-04 MED ORDER — PNEUMOCOCCAL VAC POLYVALENT 25 MCG/0.5ML IJ INJ
0.5000 mL | INJECTION | INTRAMUSCULAR | Status: AC
  Administered 2015-03-05: 0.5 mL via INTRAMUSCULAR
  Filled 2015-03-04: qty 0.5

## 2015-03-04 MED ORDER — ASPIRIN 325 MG PO TABS: 325.0000 mg | ORAL_TABLET | Freq: Every day | ORAL | Status: DC

## 2015-03-04 MED ORDER — ONDANSETRON HCL 4 MG PO TABS
4.0000 mg | ORAL_TABLET | Freq: Four times a day (QID) | ORAL | Status: DC | PRN
  Administered 2015-03-08: 4 mg via ORAL
  Filled 2015-03-04: qty 1

## 2015-03-04 MED ORDER — LORAZEPAM 1 MG PO TABS
1.0000 mg | ORAL_TABLET | Freq: Four times a day (QID) | ORAL | Status: AC | PRN
  Administered 2015-03-05: 1 mg via ORAL
  Filled 2015-03-04: qty 1

## 2015-03-04 MED ORDER — LORAZEPAM 2 MG/ML IJ SOLN: 1.0000 mg | Freq: Four times a day (QID) | INTRAMUSCULAR | Status: AC | PRN

## 2015-03-04 MED ORDER — ESCITALOPRAM OXALATE 10 MG PO TABS
10.0000 mg | ORAL_TABLET | Freq: Every day | ORAL | Status: DC
  Administered 2015-03-04 – 2015-03-08 (×5): 10 mg via ORAL
  Filled 2015-03-04 (×5): qty 1

## 2015-03-04 MED ORDER — SENNOSIDES-DOCUSATE SODIUM 8.6-50 MG PO TABS
1.0000 | ORAL_TABLET | Freq: Every evening | ORAL | Status: DC | PRN
  Filled 2015-03-04: qty 1

## 2015-03-04 MED ORDER — OXYCODONE HCL 5 MG PO TABS
5.0000 mg | ORAL_TABLET | ORAL | Status: DC | PRN
  Administered 2015-03-04 – 2015-03-06 (×7): 5 mg via ORAL
  Filled 2015-03-04 (×8): qty 1

## 2015-03-04 MED ORDER — AZITHROMYCIN 250 MG PO TABS
500.0000 mg | ORAL_TABLET | Freq: Once | ORAL | Status: AC
  Administered 2015-03-04: 500 mg via ORAL
  Filled 2015-03-04: qty 2

## 2015-03-04 MED ORDER — HYDROMORPHONE HCL 1 MG/ML IJ SOLN
0.5000 mg | INTRAMUSCULAR | Status: DC | PRN
  Administered 2015-03-04: 0.5 mg via INTRAVENOUS
  Filled 2015-03-04: qty 1

## 2015-03-04 MED ORDER — ACETAMINOPHEN 325 MG PO TABS
650.0000 mg | ORAL_TABLET | Freq: Four times a day (QID) | ORAL | Status: DC | PRN
  Administered 2015-03-04: 650 mg via ORAL
  Filled 2015-03-04 (×2): qty 2

## 2015-03-04 MED ORDER — HEPARIN BOLUS VIA INFUSION
4000.0000 [IU] | Freq: Once | INTRAVENOUS | Status: AC
  Administered 2015-03-04: 4000 [IU] via INTRAVENOUS
  Filled 2015-03-04: qty 4000

## 2015-03-04 MED ORDER — ASPIRIN 81 MG PO CHEW
81.0000 mg | CHEWABLE_TABLET | Freq: Every day | ORAL | Status: DC
  Administered 2015-03-05 – 2015-03-08 (×4): 81 mg via ORAL
  Filled 2015-03-04 (×4): qty 1

## 2015-03-04 MED ORDER — ACETAMINOPHEN 650 MG RE SUPP: 650.0000 mg | Freq: Four times a day (QID) | RECTAL | Status: DC | PRN

## 2015-03-04 MED ORDER — HYDROMORPHONE HCL 1 MG/ML IJ SOLN
1.0000 mg | INTRAMUSCULAR | Status: DC | PRN
  Administered 2015-03-04: 1 mg via INTRAVENOUS
  Filled 2015-03-04: qty 1

## 2015-03-04 MED ORDER — THIAMINE HCL 100 MG/ML IJ SOLN
100.0000 mg | Freq: Every day | INTRAMUSCULAR | Status: DC
  Filled 2015-03-04: qty 2

## 2015-03-04 MED ORDER — INSULIN ASPART 100 UNIT/ML ~~LOC~~ SOLN
0.0000 [IU] | Freq: Three times a day (TID) | SUBCUTANEOUS | Status: DC
  Administered 2015-03-05 (×2): 1 [IU] via SUBCUTANEOUS
  Administered 2015-03-06: 2 [IU] via SUBCUTANEOUS
  Administered 2015-03-06: 3 [IU] via SUBCUTANEOUS
  Administered 2015-03-07: 2 [IU] via SUBCUTANEOUS
  Administered 2015-03-07 – 2015-03-08 (×2): 1 [IU] via SUBCUTANEOUS

## 2015-03-04 MED ORDER — ADULT MULTIVITAMIN W/MINERALS CH
1.0000 | ORAL_TABLET | Freq: Every day | ORAL | Status: DC
  Administered 2015-03-04 – 2015-03-08 (×5): 1 via ORAL
  Filled 2015-03-04 (×5): qty 1

## 2015-03-04 MED ORDER — ENOXAPARIN SODIUM 40 MG/0.4ML ~~LOC~~ SOLN
40.0000 mg | SUBCUTANEOUS | Status: DC
  Filled 2015-03-04: qty 0.4

## 2015-03-04 MED ORDER — NICOTINE 21 MG/24HR TD PT24
21.0000 mg | MEDICATED_PATCH | Freq: Every day | TRANSDERMAL | Status: DC
  Filled 2015-03-04: qty 1

## 2015-03-04 MED ORDER — FOLIC ACID 1 MG PO TABS
1.0000 mg | ORAL_TABLET | Freq: Every day | ORAL | Status: DC
  Administered 2015-03-04 – 2015-03-08 (×5): 1 mg via ORAL
  Filled 2015-03-04 (×5): qty 1

## 2015-03-04 MED ORDER — DEXTROSE 5 % IV SOLN
1.0000 g | Freq: Once | INTRAVENOUS | Status: AC
  Administered 2015-03-04: 1 g via INTRAVENOUS
  Filled 2015-03-04: qty 10

## 2015-03-04 MED ORDER — ACETAMINOPHEN 325 MG PO TABS
650.0000 mg | ORAL_TABLET | Freq: Once | ORAL | Status: AC
  Administered 2015-03-04: 650 mg via ORAL
  Filled 2015-03-04: qty 2

## 2015-03-04 MED ORDER — FENTANYL CITRATE (PF) 100 MCG/2ML IJ SOLN
50.0000 ug | Freq: Once | INTRAMUSCULAR | Status: DC
  Filled 2015-03-04: qty 2

## 2015-03-04 MED ORDER — IOHEXOL 350 MG/ML SOLN
80.0000 mL | Freq: Once | INTRAVENOUS | Status: AC | PRN
  Administered 2015-03-04: 80 mL via INTRAVENOUS

## 2015-03-04 MED ORDER — SODIUM CHLORIDE 0.9 % IV SOLN
INTRAVENOUS | Status: AC
  Administered 2015-03-04: 08:00:00 via INTRAVENOUS

## 2015-03-04 MED ORDER — TRAZODONE HCL 100 MG PO TABS
100.0000 mg | ORAL_TABLET | Freq: Every evening | ORAL | Status: DC | PRN
  Administered 2015-03-05 – 2015-03-07 (×2): 100 mg via ORAL
  Filled 2015-03-04: qty 2
  Filled 2015-03-04: qty 1
  Filled 2015-03-04: qty 2

## 2015-03-04 MED ORDER — HYDROMORPHONE HCL 1 MG/ML IJ SOLN
0.5000 mg | INTRAMUSCULAR | Status: DC | PRN
  Administered 2015-03-05 (×2): 0.5 mg via INTRAVENOUS
  Filled 2015-03-04 (×3): qty 1

## 2015-03-04 MED ORDER — INSULIN ASPART 100 UNIT/ML ~~LOC~~ SOLN: 0.0000 [IU] | Freq: Every day | SUBCUTANEOUS | Status: DC

## 2015-03-04 MED ORDER — ALBUTEROL SULFATE (2.5 MG/3ML) 0.083% IN NEBU
5.0000 mg | INHALATION_SOLUTION | Freq: Once | RESPIRATORY_TRACT | Status: AC
  Administered 2015-03-04: 5 mg via RESPIRATORY_TRACT
  Filled 2015-03-04: qty 6

## 2015-03-04 MED ORDER — ONDANSETRON HCL 4 MG/2ML IJ SOLN: 4.0000 mg | Freq: Four times a day (QID) | INTRAMUSCULAR | Status: DC | PRN

## 2015-03-04 MED ORDER — HEPARIN (PORCINE) IN NACL 100-0.45 UNIT/ML-% IJ SOLN
1600.0000 [IU]/h | INTRAMUSCULAR | Status: DC
Start: 2015-03-04 — End: 2015-03-05
  Administered 2015-03-04 (×2): 1600 [IU]/h via INTRAVENOUS
  Filled 2015-03-04 (×3): qty 250

## 2015-03-04 NOTE — Progress Notes (Signed)
CTA chest reveals acute bilateral pulmonary emboli with CT evidence of right heart strain consistent with at least submassive PE. Pt is currently hemodynamically stable with no oxygen requirement. Will start IV heparin per pharmacy. I discussed results of the CT with Dr. Lamonte Sakai with PCCM who personally looked at CT image findings and agreed with step down admission with IV heparin. At this time no thrombolytic therapy warranted, will continue to monitor. Will obtain 2D-echo with contrast to assess for right heart strain.   Dr. Naaman Plummer

## 2015-03-04 NOTE — ED Notes (Signed)
MD at the bedside  

## 2015-03-04 NOTE — Progress Notes (Signed)
  Echocardiogram 2D Echocardiogram has been performed.  Nicholas Farrell 03/04/2015, 10:29 AM

## 2015-03-04 NOTE — ED Notes (Signed)
Ambulated pt around Pod D I could tell he was short of breath and pt stated he was short of breath. Pts sats started at 94% and they stayed at 94%. Christy Gentles MD and [place] RN notified. Dr.Wickline in to talk to Pt and informed him that he was going to admit him.

## 2015-03-04 NOTE — Consult Note (Signed)
ANTICOAGULATION CONSULT NOTE - F/U Consult  Pharmacy Consult for Heparin Indication: pulmonary embolus  No Known Allergies  Patient Measurements: Height: 5\' 9"  (175.3 cm) Weight: 209 lb (94.802 kg) IBW/kg (Calculated) : 70.7 Heparin Dosing Weight: 90kg  Vital Signs: Temp: 100.1 F (37.8 C) (10/07 1508) Temp Source: Oral (10/07 1508) BP: 144/66 mmHg (10/07 1508) Pulse Rate: 80 (10/07 1508)  Labs:  Recent Labs  03/04/15 0406 03/04/15 1518  HGB 15.9  --   HCT 45.8  --   PLT 183  --   HEPARINUNFRC  --  0.55  CREATININE 1.56*  --     Estimated Creatinine Clearance: 52.2 mL/min (by C-G formula based on Cr of 1.56).   Medical History: Past Medical History  Diagnosis Date  . Hypertension   . CAD (coronary artery disease)   . Diverticulosis   . Seizures ([place])     20 yrs ago, off Dilantin for 5 yrs  . Stroke [facility]) 2011    no residual  . Myocardial infarction ([place])   . Depression   . 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 Cancer ([place])     glottis cancer/vocal cord  . Diabetes mellitus     TYPE 2, takes metformin(03/04/15)    Medications:  No anticoagulants pta  Assessment: Nicholas Farrell presents to the ED with right sided chest pain and SOB. CT chest positive for bilateral submassive PE consistent with right heart strain. He will begin IV heparin. CBC wnl. Renal insufficiency noted with sCr 1.56.   Initial HL on 1600 units/hr was therapeutic at 0.55  Goal of Therapy:  Heparin level 0.3-0.7 units/ml Monitor platelets by anticoagulation protocol: Yes   Plan:  Continue Heparin drip at 1600 units/hr Daily heparin level and CBC  Levester Fresh, PharmD, BCPS Clinical Pharmacist Pager [phone] 03/04/2015 5:11 PM

## 2015-03-04 NOTE — ED Notes (Signed)
Echo tech at the bedside.

## 2015-03-04 NOTE — H&P (Signed)
Date: 03/04/2015               Patient Name:  Nicholas Farrell MRN: [id number]  DOB: 12/14/1945 Age / Sex: 70 y.o., male   PCP: Provider Default, MD         Medical Service: Internal Medicine Teaching Service         Attending Physician: Dr. Michel Bickers, MD    First Contact: Dr. Posey Pronto Pager: [phone]  Second Contact: Dr. Gordy Levan Pager: [phone]       After Hours (After 5p/  First Contact Pager: [phone]  weekends / holidays): Second Contact Pager: [phone]   Chief Complaint: right sided pleuritic chest pain   History of Present Illness:   Nicholas Farrell is a 69 year old pleasant man with past medical history of hypertension, CAD s/p MI, CVA , PE no longer on AC, non-insulin dependent Type 2 DM, seizure disorder no longer on AED, laryngeal cancer s/p resection and radiation, and depression who presents with acute onset of right sided pleuritic chest pain.   He reports that he was feeling his normal self until 6 PM last night when he was riding on the bus to [place] from [place] for alcohol detoxication program at [facility] when he suddenly had sharp right sided chest pain with deep inspiration. He denies fever, chills, cough, hemoptysis, wheezing, LE edema/pain, weight loss, night sweating, nausea, vomiting, abdominal pain, or change in BM/urination. He reports having a pulmonary embolus at least 3-4 years ago with unclear cause and was on coumadin for approximately 8-12 months. He is unsure why he is no longer on it. 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 He reports he is ambulatory and denies recent long travel.  He continues to smoke cigarettes about 0.5 pack per day, drink alcohol (which he has relapsed), and crack cocaine (last use 5 days ago).  He denies family history of cancer or clotting disorder.      Meds: Current Facility-Administered Medications  Medication Dose Route Frequency  Provider Last Rate Last Dose  . fentaNYL (SUBLIMAZE) injection 50 mcg  50 mcg Intravenous Once [name], MD       Current Outpatient Prescriptions  Medication Sig Dispense Refill  . amLODipine (NORVASC) 10 MG tablet Take 1 tablet (10 mg total) by mouth daily. 30 tablet 5  . aspirin 325 MG tablet Take 1 tablet (325 mg total) by mouth daily. 30 tablet 5  . citalopram (CELEXA) 20 MG tablet Take 20 mg by mouth daily.    Marland Kitchen escitalopram (LEXAPRO) 10 MG tablet Take 1 tablet (10 mg total) by mouth daily. 30 tablet 5  . lisinopril (PRINIVIL,ZESTRIL) 20 MG tablet Take 1 tablet (20 mg total) by mouth daily. 90 tablet 3  . metFORMIN (GLUCOPHAGE) 1000 MG tablet Take 1 tablet (1,000 mg total) by mouth 2 (two) times daily. 60 tablet 5  . potassium chloride (K-DUR) 10 MEQ tablet Take 1 tablet (10 mEq total) by mouth daily. 90 tablet 3  . traZODone (DESYREL) 100 MG tablet Take 100 mg by mouth at bedtime as needed for sleep.    Marland Kitchen acetaminophen (TYLENOL) 325 MG tablet Take 2 tablets (650 mg total) by mouth every 6 (six) hours as needed for pain. (Patient not taking: Reported on 03/04/2015) 240 tablet 5    Allergies: Allergies as of 03/04/2015  . (No Known Allergies)   Past Medical History  Diagnosis Date  . Hypertension   .  CAD (coronary artery disease)   . Diverticulosis   . Seizures ([place])     20 yrs ago, off Dilantin for 5 yrs  . Stroke [facility]) 2011    no residual  . Myocardial infarction ([place])   . Depression   . Diabetes mellitus     TYPE 2  . 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 Mouth surgery      cut bone out of upper and lower jaw to prepare for dentures  . Coronary angioplasty with stent placement    . Microlaryngoscopy  09/18/2012  . Microlaryngoscopy Left 09/18/2012    Procedure: MICROLARYNGOSCOPY;  Surgeon: Melissa Montane, MD;  Location: [place];  Service: ENT;  Laterality: Left;   MICROLARYNGOSCOPY WITH REMOVAL OF VOCAL CORD POLYP   History reviewed. No pertinent family history. Social History   Social History  . Marital Status: Single    Spouse Name: N/A  . Number of Children: N/A  . Years of Education: N/A   Occupational History  . Not on file.   Social History Main Topics  . Smoking status: Former Smoker    Quit date: 09/24/2011  . Smokeless tobacco: Never Used  . Alcohol Use: No  . Drug Use: No     Comment: histort  drug  abuse   . Sexual Activity: Not on file   Other Topics Concern  . Not on file   Social History Narrative    Review of Systems: Pertinent items are noted in HPI.  Physical Exam: Blood pressure 132/60, pulse 76, temperature 99.2 F (37.3 C), temperature source Oral, resp. rate 25, height 5\' 9"  (1.753 m), weight 209 lb (94.802 kg), SpO2 96 %.  General: In mild distress, pain with inspiration  HEENT: No oral lesions or thrush  Heart: Regular rate and rhythm with no murmur  Lungs: Decreased BS at bases with no wheezing, ronchi, or rales.  Abdomen: Soft, non-tender, non-distended, normal BS  Extremities: No edema or calf tenderness.  Skin: No rash or lesions Neuro: A & O x 3, no focal deficits   Lab results: Basic Metabolic Panel:  Recent Labs  03/04/15 0406  NA 134*  K 4.2  CL 97*  CO2 26  GLUCOSE 134*  BUN 15  CREATININE 1.56*  CALCIUM 9.2   CBC:  Recent Labs  03/04/15 0406  WBC 10.6*  NEUTROABS 6.6  HGB 15.9  HCT 45.8  MCV 93.1  PLT 183   Imaging results:  Dg Chest 2 View  03/04/2015   CLINICAL DATA:  Shortness of breath and RIGHT chest pain beginning earlier today. History of hypertension, diabetes, head and neck cancer.  EXAM: CHEST  2 VIEW  COMPARISON:  PET CT Oct 23, 2012  FINDINGS: The cardiac silhouette is upper limits of normal in size, status post coronary artery stent. Mildly calcified aortic knob. Rounded consolidation RIGHT lung base, possible pleural based. Strandy densities bilateral lung  bases. No pneumothorax. LEFT apical pleural thickening. Soft tissue planes and included osseous structures are nonsuspicious.  IMPRESSION: RIGHT lung base consolidation is possibly pleural. Followup PA and lateral chest X-ray is recommended in 3-4 weeks following trial of antibiotic therapy to ensure resolution and exclude underlying malignancy.  Bibasilar atelectasis.   Electronically Signed   By: Elon Alas M.D.   On: 03/04/2015 04:23    Other results:  Date/Time: Friday March 04 2015 03:46:29 EDT Ventricular Rate: 74 PR Interval: 168 QRS Duration: 89 QT Interval: 400 QTC Calculation: 444 R Axis: 69 Text Interpretation: Sinus rhythm Left atrial enlargement Abnormal R-wave progression, early transition artifact noted No significant change since last tracing    Assessment & Plan by Problem:  Acute Right Sided Pleuritic Chest Pain - Pt with acute onset of right sided pleuritic chest pain at 6 PM yesterday with no fever, chills, cough, or wheezing. Pt is currently hemodynamic instability with no oxygen requirement. CXR in ED revealed right lung base consolidation thought to be due to pneumonia and was started on CAP treatment in the ED with ceftriaxone and azithromycin. Pt with history of unprovoked PE a few years ago no longer on [facility] therapy. Pt with Well's score of 4.5 with moderate risk of PE. 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  -Obtain stat CTA chest PE w/ & w/o contrast, will not obtain d-dimer due to high clinical suspicion of PE -If positive for PE will start IV heparin per pharmacy and obtain 2D-echo with contrast to assess for right heart strain  -Start NS 100 mL/hr for contrast dose  -Oxygen therapy to keep SpO2 >92%  Hypertension - Currently normotensive.  -Hold home lisinopril 20 mg daily and amlodipine 10 mg daily in setting of possible PE  Non-oliguric Acute Kidney Injury - Cr  1.56 from last Cr 0.97 on 09/17/12. Pt reports decreased PO intake most likely pre-renal azotemia in setting of of volume depletion.  -Start NS 100 mL/hr x 10 hrs  -Obtain UA  Non-insulin dependent Type 2 DM - BG 134 this AM.  Last A1c 15.9 on 06/19/12. Pt at home on metformin 1000 mg BID.  -Hold home metformin 1000 mg BID  -Obtain A1c -Start SSI if BG >150  CAD with history of MI - Pt with no anginal symptoms.  -Reduce home aspirin 325 mg daily to 81 mg daily as there is no benefit if 325 mg dosing compared to 81 mg -Obtain lipid panel  -Hold home lisinopril 20 mg daily in setting of possible PE -Avoid BB in setting of cocaine use   Depression - Currently with stable mood. Pt at home on celexa 20 mg daily and lexapro 10 mg dialy.  -Continue home lexapro 10 mg daily  -Unclear if pt taking celexa 20 mg daily -Continue home trazodone 100 mg PRN insomnia   Alcohol abuse - Pt with relapse and was scheduled to start rehab treatment. Date of last drink unknown.  -Start CIWA protocol  -Obtain magnesium, hepatic function panel, and phosphorus   Cocaine Abuse - Pt reports crack cocaine five days ago.   -Obtain UDS -Counsel on cessation  -Consult SW  Tobacco abuse - Pt smokes 0.5 ppd.  -Start nicotine patch 21 mg daily   HIV and HCV Screening - Pt with no prior screening.  -Obtain HIV and HCV Ab   Diet: Carb modified  DVT Ppx: Lovenox for now, if PE will start IV heparin Code: Full   Dispo: Disposition is deferred at this time, awaiting improvement of current medical problems. Anticipated discharge in approximately 2-4 day(s).   The patient does not know have a current PCP (Provider Default, MD) and does need an [facility] hospital follow-up appointment after discharge.  The patient does have transportation limitations that hinder transportation to clinic appointments.  Signed: Juluis Mire, MD 03/04/2015, 7:09 AM

## 2015-03-04 NOTE — ED Notes (Signed)
Attempted to call report x 1  

## 2015-03-04 NOTE — ED Notes (Signed)
Pt. Was trying to get into a detox program and was sitting with [employer] and started having right sided chest pain that hurts on inspiration

## 2015-03-04 NOTE — ED Provider Notes (Signed)
History  By signing my name below, I, Nicholas Farrell, attest that this documentation has been prepared under the direction and in the presence of Nicholas Fraise, MD. Electronically Signed: Marlowe Farrell, ED Scribe. 03/04/2015. 3:59 AM.  Chief Complaint  Patient presents with  . Chest Pain   The history is provided by the patient, medical records and the EMS personnel. No language interpreter was used.    HPI Comments:  Nicholas Farrell is a 69 y.o. male brought in by EMS, who presents to the Emergency Department complaining of worsening, sharp, right-sided chest pain that began approximately 10 hours ago. He reports associated SOB. Pt reports he was riding on the bus into [place] to start detox treatment for substance abuse at the [facility]. He states he successfully completed the program once but relapsed after two years.  He has not done anything to treat his symptoms. Deep breathing increases his pain. He denies modifying factors. He denies fevers, chills, nausea, vomiting, hemoptysis, pain or swelling of lower extremities, numbness, tingling or weakness of any extremity and abdominal pain. He denies trauma, injury or fall. He denies illicit drug use today.  Past Medical History  Diagnosis Date  . Hypertension   . CAD (coronary artery disease)   . Diverticulosis   . Seizures ([place])     20 yrs ago, off Dilantin for 5 yrs  . Stroke [facility]) 2011    no residual  . Myocardial infarction ([place])   . Depression   . Diabetes mellitus     TYPE 2  . 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 Mouth surgery      cut bone out of upper and lower jaw to prepare for dentures  . Coronary angioplasty with stent placement    . Microlaryngoscopy  09/18/2012  . Microlaryngoscopy Left 09/18/2012    Procedure: MICROLARYNGOSCOPY;  Surgeon: Melissa Montane, MD;  Location: [place];  Service: ENT;  Laterality:  Left;  MICROLARYNGOSCOPY WITH REMOVAL OF VOCAL CORD POLYP   History reviewed. No pertinent family history. Social History  Substance Use Topics  . Smoking status: Former Smoker    Quit date: 09/24/2011  . Smokeless tobacco: Never Used  . Alcohol Use: No    Review of Systems  Constitutional: Negative for fever and chills.  Respiratory: Positive for cough and shortness of breath.   Cardiovascular: Positive for chest pain. Negative for leg swelling.  Gastrointestinal: Negative for nausea, vomiting and abdominal pain.  Neurological: Negative for weakness and numbness.  All other systems reviewed and are negative.   Allergies  Review of patient's allergies indicates no known allergies.  Home Medications   Prior to Admission medications   Medication Sig Start Date End Date Taking? Authorizing Provider  acetaminophen (TYLENOL) 325 MG tablet Take 2 tablets (650 mg total) by mouth every 6 (six) hours as needed for pain. 09/10/12   Harden Mo, MD  amLODipine (NORVASC) 10 MG tablet Take 1 tablet (10 mg total) by mouth daily. 09/10/12   Harden Mo, MD  aspirin 325 MG tablet Take 1 tablet (325 mg total) by mouth daily. 09/10/12   Harden Mo, MD  citalopram (CELEXA) 20 MG tablet Take 20 mg by mouth daily.    Historical Provider, MD  escitalopram (LEXAPRO) 10 MG tablet Take 1 tablet (10 mg total) by mouth daily. 09/10/12   Shanon Brow  Collier Bullock, MD  lisinopril (PRINIVIL,ZESTRIL) 20 MG tablet Take 1 tablet (20 mg total) by mouth daily. 08/27/12   Sherren Mocha, MD  metFORMIN (GLUCOPHAGE) 1000 MG tablet Take 1 tablet (1,000 mg total) by mouth 2 (two) times daily. 09/10/12   Harden Mo, MD  potassium chloride (K-DUR) 10 MEQ tablet Take 1 tablet (10 mEq total) by mouth daily. 08/28/12   Sherren Mocha, MD  traZODone (DESYREL) 100 MG tablet Take 100 mg by mouth at bedtime as needed for sleep.    Historical Provider, MD   Triage Vitals: BP 164/75 mmHg  Pulse 72  Temp(Src) 99.4 F (37.4 C) (Oral)   Resp 25  Ht 5\' 9"  (1.753 m)  Wt 209 lb (94.802 kg)  BMI 30.85 kg/m2  SpO2 97%   Physical Exam  CONSTITUTIONAL: Well developed/well nourished HEAD: Normocephalic/atraumatic EYES: EOMI/PERRL ENMT: Mucous membranes moist NECK: supple no meningeal signs SPINE/BACK:entire spine nontender CV: S1/S2 noted, no murmurs/rubs/gallops noted LUNGS: decreased breath sounds bilaterally. Crackles bilaterally.  CHEST: mild tenderness to right chest. No bruising or crepitus ABDOMEN: soft, nontender, no rebound or guarding, bowel sounds noted throughout abdomen GU:no cva tenderness NEURO: Pt is awake/alert/appropriate, moves all extremitiesx4.  No facial droop.   EXTREMITIES: pulses normal/equal, full ROM SKIN: warm, color normal PSYCH: no abnormalities of mood noted, alert and oriented to situation   ED Course  Procedures  DIAGNOSTIC STUDIES: Oxygen Saturation is 97% on RA, normal by my interpretation.   COORDINATION OF CARE: 3:52 AM- Will order labs, imaging and Tylenol for pain. Pt verbalizes understanding and agrees to plan.  7:11 AM Pt here from out of town to go back into [facility] for due to substance abuse After arriving he has had mild cough, sob and chest pain His pain was worse with deep breathing but also worse with palpation CXR revealed probable pneumonia He is not septic appearing No tachycardia to suggest acute PE On ambulation, his pulse ox dropped to 94% and he had increased work of breathing Due to age/history will admit for observation D/w IM resident, admit to dr Megan Salon service BP 132/60 mmHg  Pulse 76  Temp(Src) 99.2 F (37.3 C) (Oral)  Resp 25  Ht 5\' 9"  (1.753 m)  Wt 209 lb (94.802 kg)  BMI 30.85 kg/m2  SpO2 96%   Medications  fentaNYL (SUBLIMAZE) injection 50 mcg (not administered)  acetaminophen (TYLENOL) tablet 650 mg (650 mg Oral Given 03/04/15 0427)  cefTRIAXone (ROCEPHIN) 1 g in dextrose 5 % 50 mL IVPB (0 g Intravenous Stopped 03/04/15 0615)   azithromycin (ZITHROMAX) tablet 500 mg (500 mg Oral Given 03/04/15 0521)  albuterol (PROVENTIL) (2.5 MG/3ML) 0.083% nebulizer solution 5 mg (5 mg Nebulization Given 03/04/15 0521)    Labs Review Labs Reviewed  BASIC METABOLIC PANEL - Abnormal; Notable for the following:    Sodium 134 (*)    Chloride 97 (*)    Glucose, Bld 134 (*)    Creatinine, Ser 1.56 (*)    GFR calc non Af Amer 44 (*)    GFR calc Af Amer 51 (*)    All other components within normal limits  CBC WITH DIFFERENTIAL/PLATELET - Abnormal; Notable for the following:    WBC 10.6 (*)    Monocytes Absolute 1.5 (*)    All other components within normal limits    Imaging Review Dg Chest 2 View  03/04/2015   CLINICAL DATA:  Shortness of breath and RIGHT chest pain beginning earlier today. History of hypertension, diabetes, head and  neck cancer.  EXAM: CHEST  2 VIEW  COMPARISON:  PET CT Oct 23, 2012  FINDINGS: The cardiac silhouette is upper limits of normal in size, status post coronary artery stent. Mildly calcified aortic knob. Rounded consolidation RIGHT lung base, possible pleural based. Strandy densities bilateral lung bases. No pneumothorax. LEFT apical pleural thickening. Soft tissue planes and included osseous structures are nonsuspicious.  IMPRESSION: RIGHT lung base consolidation is possibly pleural. Followup PA and lateral chest X-ray is recommended in 3-4 weeks following trial of antibiotic therapy to ensure resolution and exclude underlying malignancy.  Bibasilar atelectasis.   Electronically Signed   By: Elon Alas M.D.   On: 03/04/2015 04:23   I have personally reviewed and evaluated these images and lab results as part of my medical decision-making.   EKG Interpretation   Date/Time:  Friday March 04 2015 03:46:29 EDT Ventricular Rate:  74 PR Interval:  168 QRS Duration: 89 QT Interval:  400 QTC Calculation: 444 R Axis:   69 Text Interpretation:  Sinus rhythm Left atrial enlargement Abnormal R-wave   progression, early transition artifact noted No significant change since  last tracing Confirmed by Christy Gentles  MD, Elenore Rota ([id number]) on 03/04/2015  4:00:16 AM      MDM   Final diagnoses:  CAP (community acquired pneumonia)    Nursing notes including past medical history and social history reviewed and considered in documentation xrays/imaging reviewed by myself and considered during evaluation Labs/vital reviewed myself and considered during evaluation   I, Sharyon Cable, personally performed the services described in this documentation. All medical record entries made by the scribe were at my direction and in my presence.  I have reviewed the chart and agree that the record reflects my personal performance and is accurate and complete. Sharyon Cable.  03/04/2015. 7:07 AM.      Nicholas Fraise, MD 03/04/15 [phone]

## 2015-03-04 NOTE — H&P (Signed)
   Date: 03/04/2015  Patient name: Nicholas Farrell  Medical record number: [id number]  Date of birth: [birth year]   I have seen and evaluated Nicholas Farrell and discussed their care with the Residency Team.   Assessment and Plan: I have seen and evaluated the patient as outlined above. I agree with the formulated Assessment and Plan as detailed in the residents' admission note. Nicholas Farrell has a history of pulmonary embolus treated with 6 months of Coumadin several years ago. He also has a history of diabetes mellitus, hypertension, prior left CVA without residual neurologic deficits, depression and polysubstance abuse. He recently relapsed with alcohol use, marijuana use and crack cocaine use. He lives in [place], [place] and traveled here yesterday by bus to reenter long-term substance abuse treatment at [facility]. After the bus ride he began to have severe right-sided pleuritic chest pain and shortness of breath. He has a please to call EMS and was brought here. Initially he was felt to have any acquired pneumonia but underwent spiral CT which showed bilateral pulmonary emboli with complete occlusion of arterial flow to some areas of the right lower lobe with a pulmonary infarct. He has some evidence of right heart strain on CT but he is hemodynamically stable so he has no current indication for thrombolytic therapy. He was started on a continuous infusion of unfractionated heparin and we'll watch him closely overnight to make sure that he does not develop any further complications that might require thrombolytic therapy.  Michel Bickers, MD 10/7/20164:31 PM

## 2015-03-04 NOTE — ED Notes (Signed)
Pt sleeping soundly, no acute distress noted.

## 2015-03-04 NOTE — Consult Note (Signed)
ANTICOAGULATION CONSULT NOTE - Initial Consult  Pharmacy Consult for Heparin Indication: pulmonary embolus  No Known Allergies  Patient Measurements: Height: 5\' 9"  (175.3 cm) Weight: 209 lb (94.802 kg) IBW/kg (Calculated) : 70.7 Heparin Dosing Weight: 90kg  Vital Signs: Temp: 99.2 F (37.3 C) (10/07 0526) Temp Source: Oral (10/07 0526) BP: 137/58 mmHg (10/07 0900) Pulse Rate: 71 (10/07 0900)  Labs:  Recent Labs  03/04/15 0406  HGB 15.9  HCT 45.8  PLT 183  CREATININE 1.56*    Estimated Creatinine Clearance: 52.2 mL/min (by C-G formula based on Cr of 1.56).   Medical History: Past Medical History  Diagnosis Date  . Hypertension   . CAD (coronary artery disease)   . Diverticulosis   . Seizures ([place])     20 yrs ago, off Dilantin for 5 yrs  . Stroke [facility]) 2011    no residual  . Myocardial infarction ([place])   . Depression   . 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 Cancer ([place])     glottis cancer/vocal cord  . Diabetes mellitus     TYPE 2, takes metformin(03/04/15)    Medications:  No anticoagulants pta  Assessment: 69yom presents to the ED with right sided chest pain and SOB. CT chest positive for bilateral submassive PE consistent with right heart strain. He will begin IV heparin. CBC wnl. Renal insufficiency noted with sCr 1.56.   Goal of Therapy:  Heparin level 0.3-0.7 units/ml Monitor platelets by anticoagulation protocol: Yes   Plan:  1) Heparin bolus 4000 units x 1 2) Heparin drip at 1600 units/hr 3) Check 6 hour heparin level 4) Daily heparin level and CBC  Deboraha Sprang 03/04/2015,9:04 AM

## 2015-03-05 DIAGNOSIS — F1910 Other psychoactive substance abuse, uncomplicated: Secondary | ICD-10-CM | POA: Diagnosis present

## 2015-03-05 DIAGNOSIS — I2699 Other pulmonary embolism without acute cor pulmonale: Secondary | ICD-10-CM | POA: Diagnosis not present

## 2015-03-05 DIAGNOSIS — F1020 Alcohol dependence, uncomplicated: Secondary | ICD-10-CM | POA: Diagnosis not present

## 2015-03-05 DIAGNOSIS — J159 Unspecified bacterial pneumonia: Secondary | ICD-10-CM | POA: Diagnosis not present

## 2015-03-05 DIAGNOSIS — F1420 Cocaine dependence, uncomplicated: Secondary | ICD-10-CM | POA: Diagnosis not present

## 2015-03-05 DIAGNOSIS — F17210 Nicotine dependence, cigarettes, uncomplicated: Secondary | ICD-10-CM | POA: Diagnosis not present

## 2015-03-05 LAB — BASIC METABOLIC PANEL
Anion gap: 8 (ref 5–15)
BUN: 10 mg/dL (ref 6–20)
CALCIUM: 8.5 mg/dL — AB (ref 8.9–10.3)
CO2: 28 mmol/L (ref 22–32)
Chloride: 102 mmol/L (ref 101–111)
Creatinine, Ser: 1.18 mg/dL (ref 0.61–1.24)
GFR calc Af Amer: >60 mL/min (ref >60)
GLUCOSE: 120 mg/dL — AB (ref 65–99)
Potassium: 4 mmol/L (ref 3.5–5.1)
SODIUM: 138 mmol/L (ref 135–145)

## 2015-03-05 LAB — RAPID URINE DRUG SCREEN, HOSP PERFORMED
AMPHETAMINES: NOT DETECTED
Barbiturates: NOT DETECTED
Benzodiazepines: POSITIVE — AB
Cocaine: POSITIVE — AB
Opiates: POSITIVE — AB
TETRAHYDROCANNABINOL: NOT DETECTED

## 2015-03-05 LAB — GLUCOSE, CAPILLARY
GLUCOSE-CAPILLARY: 129 mg/dL — AB (ref 65–99)
GLUCOSE-CAPILLARY: 149 mg/dL — AB (ref 65–99)
GLUCOSE-CAPILLARY: 130 mg/dL — AB (ref 65–99)
Glucose-Capillary: 117 mg/dL — ABNORMAL HIGH (ref 65–99)

## 2015-03-05 LAB — HEMOGLOBIN A1C
HEMOGLOBIN A1C: 7.1 % — AB (ref 4.8–5.6)
MEAN PLASMA GLUCOSE: 157 mg/dL

## 2015-03-05 LAB — CBC
HCT: 41.2 % (ref 39.0–52.0)
Hemoglobin: 14.1 g/dL (ref 13.0–17.0)
MCH: 32.2 pg (ref 26.0–34.0)
MCHC: 34.2 g/dL (ref 30.0–36.0)
MCV: 94.1 fL (ref 78.0–100.0)
PLATELETS: 186 10*3/uL (ref 150–400)
RBC: 4.38 MIL/uL (ref 4.22–5.81)
RDW: 13.6 % (ref 11.5–15.5)
WBC: 11.9 10*3/uL — ABNORMAL HIGH (ref 4.0–10.5)

## 2015-03-05 LAB — URINALYSIS, ROUTINE W REFLEX MICROSCOPIC
BILIRUBIN URINE: NEGATIVE
Glucose, UA: NEGATIVE mg/dL
HGB URINE DIPSTICK: NEGATIVE
Ketones, ur: 15 mg/dL — AB
Leukocytes, UA: NEGATIVE
Nitrite: NEGATIVE
PH: 5.5 (ref 5.0–8.0)
Protein, ur: NEGATIVE mg/dL
SPECIFIC GRAVITY, URINE: 1.046 — AB (ref 1.005–1.030)
Urobilinogen, UA: 0.2 mg/dL (ref 0.0–1.0)

## 2015-03-05 LAB — HEPATITIS C ANTIBODY (REFLEX): HCV Ab: <0.1 s/co ratio (ref 0.0–0.9)

## 2015-03-05 LAB — HEPARIN LEVEL (UNFRACTIONATED): Heparin Unfractionated: 0.66 IU/mL (ref 0.30–0.70)

## 2015-03-05 LAB — HCV COMMENT:

## 2015-03-05 LAB — HIV ANTIBODY (ROUTINE TESTING W REFLEX): HIV Screen 4th Generation wRfx: NONREACTIVE

## 2015-03-05 MED ORDER — RIVAROXABAN 15 MG PO TABS
15.0000 mg | ORAL_TABLET | Freq: Two times a day (BID) | ORAL | Status: DC
  Administered 2015-03-05 – 2015-03-08 (×7): 15 mg via ORAL
  Filled 2015-03-05 (×9): qty 1

## 2015-03-05 MED ORDER — HYDROMORPHONE HCL 1 MG/ML IJ SOLN: 0.5000 mg | INTRAMUSCULAR | Status: DC | PRN

## 2015-03-05 MED ORDER — AMLODIPINE BESYLATE 10 MG PO TABS
10.0000 mg | ORAL_TABLET | Freq: Every day | ORAL | Status: DC
  Administered 2015-03-05 – 2015-03-08 (×4): 10 mg via ORAL
  Filled 2015-03-05 (×4): qty 1

## 2015-03-05 MED ORDER — ENOXAPARIN SODIUM 80 MG/0.8ML ~~LOC~~ SOLN: 80.0000 mg | Freq: Two times a day (BID) | SUBCUTANEOUS | Status: DC

## 2015-03-05 NOTE — Progress Notes (Addendum)
Subjective: Patient reports some improvement in his pain and breathing, but still not under control. Has spoken to his wife and son who are pastors and still plans to join [facility] for substance abuse. He says they do not provide medical care there but will transport him to any appointments. Objective: Vital signs in last 24 hours: Filed Vitals:   03/05/15 0600 03/05/15 0700 03/05/15 0753 03/05/15 0800  BP: 158/69 147/68  171/69  Pulse: 67   70  Temp:   98.7 F (37.1 C)   TempSrc:  Oral Oral   Resp: 18 28  30   Height:      Weight:      SpO2: 93% 98%  98%   Weight change: -15 lb 10.5 oz (-7.102 kg)  Intake/Output Summary (Last 24 hours) at 03/05/15 8413 Last data filed at 03/05/15 0600  Gross per 24 hour  Intake  424.8 ml  Output    800 ml  Net -375.2 ml   General: resting in bed, some distress Cardiac: RRR, no murmurs or gallops Pulm: upper airway expiratory wheezing Abd: soft, nontender, nondistended, BS present Ext: warm and well perfused, no pedal edema   Assessment/Plan: Principal Problem:   Acute pulmonary embolus (HCC) Active Problems:   Polysubstance abuse   Diabetes mellitus type 2, uncontrolled (HCC)   Hypertension   Acute kidney injury ([place])   History of CVA (cerebrovascular accident)   CAD (coronary artery disease)   History of MI (myocardial infarction)   Depression   Diastolic dysfunction   Hyperlipidemia  Acute pulmonary emboli bilaterally with RLL pulmonary infarct: Patient with history of PE treated with coumadin for 6 months about 4 years ago, T2DM, polysubstance abuse, depression, left sided CVA w/o residual deficit who was on a bus from [place] to [place] to enter a rehab program at [facility] when he started to have right sided chest pain worse with inspiration and associated with dyspnea. He called the police and was transferred to our ED. CXR showed right lung base consolidation concerning for pneumonia, but further exam of  patient did not correlate as patient was dyspneic and in moderate distress. This suggested possible PE and CT Angio was done which confirmed bilateral emboli with occlusion on the right side causing right lower lobe pulmonary infarct in the anterior segment. Imaging showed evidence of right heart strain and patient was started on IV unfractionated heparin rather than Lovenox due to possible need for thrombolytics. Echo was done which done not read right heart strain. Patient has been satting above 92% on room air and not tachycardic. Pleural rub was audible on right lateral chest yesterday, but could not appreciate today. As this is a recurrent PE, patient will need to be on anticoagulation therapy indefinitely. Patient also continues with pain that is not under control which will need to be improved prior to discharge. -Options for [facility] include Rivaroxaban (xarelto) or Apixaban (eliquis), Coumadin, or LMWH. He has done well on coumadin before, but may benefit on medication such as Xarelto which would not need interval monitoring as this might not be done at his rehab facility.  Alen Blew and Eliquis both need prior approval before starting, will work to see if we can get this done soon, otherwise will start coumadin after lovenox bridge. -D/C Heparin and start Lovenox -Continue pain management with Dilaudid 0.5 mg IV q2h prn and Oxycodone IR 5 mg q3h prn for breakthrough pain and monitor for control.   Polysubstance abuse: Patient with history of substance  abuse who says he was 2 years sober before relapsing 3 weeks ago with alcohol, marijuana, and crack cocaine. He has been drinking a pint of liquor with last drink 4-5 days ago. He has no withdrawal symptoms of tremors, hallucinations, or anxiety/agitation at this time. CIWA has been 0 so far. Will be tricky to control his pain on outpatient basis as he is going to [facility] for substance abuse (alcohol, cocaine), and unsure if he would be able to have pain  medication while there. -Continue CIWA protocol -Continue with plans at [place] set up to f/u with our clinic for care while in [place]: Patient to qualify for 30 day supply of Xarelto per Case Management. Will start Xarelto 15 mg twice daily for 21 days, then 20 mg once daily with food indefinitely.   Dispo: Disposition is deferred at this time, awaiting improvement of current medical problems.  Anticipated discharge in approximately 1-3 day(s).      Zada Finders, MD 03/05/2015, 9:58 AM

## 2015-03-05 NOTE — Care Management Note (Signed)
Case Management Note  Patient Details  Name: Nicholas Farrell MRN: [id number] Date of Birth: 10-10-1945  Subjective/Objective:                   right-sided pleuritic chest pain and shortness of breath Action/Plan: Discharge planning  Expected Discharge Date:  03/05/15              Expected Discharge Plan:  Home/Self Care  In-House Referral:     Discharge planning Services  CM Consult, Medication Assistance  Post Acute Care Choice:    Choice offered to:     DME Arranged:    DME Agency:     HH Arranged:    [place] Agency:     Status of Service:  Completed, signed off  Medicare Important Message Given:    Date Medicare IM Given:    Medicare IM give by:    Date Additional Medicare IM Given:    Additional Medicare Important Message give by:     If discussed at [place] of Stay Meetings, dates discussed:    Additional Comments: CM met with pt and gave pt free 30 day trial card for Xarelto.  Pt verbalized understanding the card will cove the expense of today's discharge prescription and give enough time for medication to be authorized for refills.  Pt is from [place] but will be discharging  to [facility] post discharge.  CM called CSW to make aware (of plan to go to [facility]) for transition needs and probable transportation.  No other CM needs were communicated. Dellie Catholic, RN 03/05/2015, 12:15 PM

## 2015-03-05 NOTE — Progress Notes (Signed)
Patient ID: Nicholas Farrell, male   DOB: 04-15-1946, 69 y.o.   MRN: [id number]  Date: 03/05/2015  Patient name: Nicholas Farrell  Medical record number: [id number]  Date of birth: 30-Dec-1945   This patient's plan of care was discussed with the house staff. Please see their note for complete details. I concur with their findings. [name] continues to have right-sided pleuritic chest pain that he rates 8/10. His nurse states that he did not receive much pain medication overnight. She just gave him pain medication 30 minutes ago. He has mild shortness of breath but minimal cough. His heparin level is therapeutic. He has a Medicare part D. Prescription drug plan. He states that he had no trouble with his Coumadin therapy when he was treated for pulmonary embolus several years ago. He did not have any bleeding and it does not sound like it was difficult to keep his INR into therapeutic range. He is certainly willing to restart Coumadin now but we will see if his plan covers rivaroxiban. This would be much simpler regimen for him. He would not require lengthy bridging with heparin or outpatient monitoring. He still plans to go to [place] to enter the addiction recovery program upon discharge.   Michel Bickers, MD 03/05/2015, 10:42 AM

## 2015-03-05 NOTE — Progress Notes (Addendum)
ANTICOAGULATION CONSULT NOTE - Follow Up Consult  Pharmacy Consult for heparin Indication: pulmonary embolus  No Known Allergies  Patient Measurements: Height: 5\' 9"  (175.3 cm) Weight: 193 lb 5.5 oz (87.7 kg) IBW/kg (Calculated) : 70.7 Heparin Dosing Weight: 90 kg  Vital Signs: Temp: 98.7 F (37.1 C) (10/08 0753) Temp Source: Oral (10/08 0753) BP: 171/69 mmHg (10/08 0800) Pulse Rate: 70 (10/08 0800)  Labs:  Recent Labs  03/04/15 0406 03/04/15 1518 03/05/15 0330  HGB 15.9  --  14.1  HCT 45.8  --  41.2  PLT 183  --  186  HEPARINUNFRC  --  0.55 0.66  CREATININE 1.56*  --  1.18    Estimated Creatinine Clearance: 66.6 mL/min (by C-G formula based on Cr of 1.18).   Assessment: 69 yo m admitted with bilateral submassive PE with right heart strain. Pharmacy is consulted to dose heparin.  HL is therapeutic this AM at 0.66 on 1600 units/hr. CBC stable.  Goal of Therapy:  Heparin level 0.3-0.7 units/ml Monitor platelets by anticoagulation protocol: Yes   Plan:  Continue heparin at 1600 units/hr F/u AM HL Monitor s/s of bleeding  Cresta Riden L. Nicole Kindred, PharmD Clinical Pharmacy Resident Pager: [phone] 03/05/2015 10:05 AM  Pharmacy is now consulted to transition patient to Lovenox while awaiting to see if we can get prior authorization for his Xarelto.  Will d/c heparin infusion and schedule Lovenox 80 mg SQ q12h to begin 1 hour later.   Stop heparin Start Lovenox 80 mg SQ q12h CBC in AM  Parke Jandreau L. Nicole Kindred, PharmD Clinical Pharmacy Resident Pager: [phone] 03/05/2015 11:47 AM  Patient can get Xarelto so will transition to [place] now.  Education already done. Pharmacy will sign off.  Jasir Rother L. Nicole Kindred, PharmD PGY2 Infectious Diseases Pharmacy Resident Pager: [phone] 03/05/2015 12:17 PM

## 2015-03-06 DIAGNOSIS — J159 Unspecified bacterial pneumonia: Secondary | ICD-10-CM | POA: Diagnosis not present

## 2015-03-06 LAB — GLUCOSE, CAPILLARY
Glucose-Capillary: 112 mg/dL — ABNORMAL HIGH (ref 65–99)
Glucose-Capillary: 205 mg/dL — ABNORMAL HIGH (ref 65–99)
Glucose-Capillary: 169 mg/dL — ABNORMAL HIGH (ref 65–99)
Glucose-Capillary: 165 mg/dL — ABNORMAL HIGH (ref 65–99)

## 2015-03-06 MED ORDER — ACETAMINOPHEN 325 MG PO TABS
650.0000 mg | ORAL_TABLET | ORAL | Status: DC | PRN
  Administered 2015-03-06 – 2015-03-07 (×3): 650 mg via ORAL
  Filled 2015-03-06 (×3): qty 2

## 2015-03-06 MED ORDER — LISINOPRIL 10 MG PO TABS
10.0000 mg | ORAL_TABLET | Freq: Every day | ORAL | Status: DC
  Administered 2015-03-06 – 2015-03-08 (×3): 10 mg via ORAL
  Filled 2015-03-06 (×3): qty 1

## 2015-03-06 MED ORDER — ACETAMINOPHEN 650 MG RE SUPP: 650.0000 mg | RECTAL | Status: DC | PRN

## 2015-03-06 MED ORDER — OXYCODONE HCL 5 MG PO TABS
5.0000 mg | ORAL_TABLET | ORAL | Status: DC | PRN
  Administered 2015-03-06 (×2): 5 mg via ORAL
  Filled 2015-03-06 (×2): qty 1

## 2015-03-06 NOTE — Progress Notes (Addendum)
Patient arrive from unit from [facility],. No family at bedside. Patient oriented to room and call bell within reach.

## 2015-03-06 NOTE — Progress Notes (Signed)
   Subjective:  VSS. Pt still chest pain that is constant but worsens with cough and taking a deep breath.  Has consistently taken oxycodone for pain but states this only helps somewhat and for several hours and then returns.    We will need to d/c IV pain meds and transition to po meds only in preparation for d/c.  Will also transfer to med-surg today.    Objective: Vital signs in last 24 hours: Filed Vitals:   03/05/15 1621 03/05/15 2031 03/05/15 2338 03/06/15 0736  BP: 145/65 165/57 161/59 149/64  Pulse:      Temp: 98.4 F (36.9 C) 99.2 F (37.3 C) 100.5 F (38.1 C) 99 F (37.2 C)  TempSrc: Oral Oral Oral Oral  Resp: 20 24 28 20   Height:      Weight:      SpO2: 92% 93% 94% 90%   Weight change:   Intake/Output Summary (Last 24 hours) at 03/06/15 0959 Last data filed at 03/06/15 1700  Gross per 24 hour  Intake    480 ml  Output      0 ml  Net    480 ml   General: Sitting up in bed in NAD eating breakfast  Cardiac: RRR, no murmurs or gallops Pulm: CTAB although difficult to assess d/t pain  Abd: soft, nontender, nondistended, BS present Ext: warm and well perfused, no pedal edema   Assessment/Plan: Principal Problem:   Acute pulmonary embolus (HCC) Active Problems:   Diabetes mellitus type 2, uncontrolled (HCC)   Hypertension   Pleuritic chest pain   History of CVA (cerebrovascular accident)   History of MI (myocardial infarction)   Depression   Diastolic dysfunction   Hyperlipidemia   Polysubstance abuse  Acute pulmonary emboli bilaterally with RLL pulmonary infarct:  Pt hemodynamically stable.  Received 5 doses of oxycodone yesterday with latest at 4:17AM.  He is currently on xarelto and qualified for a 30 d supply per CM.  Will cont xarelto 15mg  bid for 21 days and then 20mg  qd.  He had one value of a temp of 100.5 at 11:38PM last night which may be d/t emboli.  Still c/o pain that is somewhat relieved with oxycodone.  I would not favor opioids for pleuritic  chest pain but our options are limited d/t being on xarelto.   -d/c dilaudid (need to be off IV pain meds prior to d/c) -oxycodone q4h PRN for severe pain, otherwise tylenol PRN for mild, moderate  -transfer to med-surg -cont xarelto  HTN BP running a bit high, currently on amlodipine.  Pain may also be contributing.  -cont amlodipine and will add back lisinopril at 10mg  (SCr OK today)   Polysubstance abuse CIWA scores 0 yesterday.  Will need to discuss transitioning to [place] with CSW.   -cont to monitor -CIWA   Dispo Stable to transfer to med-surg and likely d/c tomorrow pending ability to d/c pain meds and coordination with [place].    Jones Bales, MD 03/06/2015, 9:59 AM

## 2015-03-06 NOTE — Progress Notes (Signed)
Pt stable for lower level of care.  Report called to Wells Guiles, RN on 6E.  Pt notified family of transfer.  Pt transferred via wheelchair with tech.

## 2015-03-07 DIAGNOSIS — J159 Unspecified bacterial pneumonia: Secondary | ICD-10-CM | POA: Diagnosis not present

## 2015-03-07 DIAGNOSIS — I2699 Other pulmonary embolism without acute cor pulmonale: Secondary | ICD-10-CM | POA: Diagnosis not present

## 2015-03-07 DIAGNOSIS — F1420 Cocaine dependence, uncomplicated: Secondary | ICD-10-CM | POA: Diagnosis not present

## 2015-03-07 DIAGNOSIS — F17210 Nicotine dependence, cigarettes, uncomplicated: Secondary | ICD-10-CM | POA: Diagnosis not present

## 2015-03-07 DIAGNOSIS — F1020 Alcohol dependence, uncomplicated: Secondary | ICD-10-CM | POA: Diagnosis not present

## 2015-03-07 LAB — BASIC METABOLIC PANEL
Anion gap: 10 (ref 5–15)
BUN: 8 mg/dL (ref 6–20)
CALCIUM: 8.6 mg/dL — AB (ref 8.9–10.3)
CO2: 26 mmol/L (ref 22–32)
CREATININE: 0.96 mg/dL (ref 0.61–1.24)
Chloride: 101 mmol/L (ref 101–111)
GFR calc Af Amer: >60 mL/min (ref >60)
GLUCOSE: 128 mg/dL — AB (ref 65–99)
POTASSIUM: 3.3 mmol/L — AB (ref 3.5–5.1)
SODIUM: 137 mmol/L (ref 135–145)

## 2015-03-07 LAB — GLUCOSE, CAPILLARY
GLUCOSE-CAPILLARY: 143 mg/dL — AB (ref 65–99)
GLUCOSE-CAPILLARY: 152 mg/dL — AB (ref 65–99)
GLUCOSE-CAPILLARY: 115 mg/dL — AB (ref 65–99)
Glucose-Capillary: 127 mg/dL — ABNORMAL HIGH (ref 65–99)

## 2015-03-07 MED ORDER — RIVAROXABAN (XARELTO) VTE STARTER PACK (15 & 20 MG): ORAL_TABLET | ORAL | Status: DC

## 2015-03-07 MED ORDER — ASPIRIN 81 MG PO CHEW: 81.0000 mg | CHEWABLE_TABLET | Freq: Every day | ORAL | Status: AC

## 2015-03-07 MED ORDER — TRAMADOL HCL 50 MG PO TABS: 100.0000 mg | ORAL_TABLET | Freq: Two times a day (BID) | ORAL | Status: AC | PRN

## 2015-03-07 MED ORDER — TRAMADOL HCL 50 MG PO TABS
50.0000 mg | ORAL_TABLET | Freq: Four times a day (QID) | ORAL | Status: DC | PRN
  Administered 2015-03-07 (×2): 100 mg via ORAL
  Administered 2015-03-08: 50 mg via ORAL
  Filled 2015-03-07 (×3): qty 2

## 2015-03-07 MED ORDER — POTASSIUM CHLORIDE CRYS ER 20 MEQ PO TBCR
40.0000 meq | EXTENDED_RELEASE_TABLET | Freq: Once | ORAL | Status: AC
  Administered 2015-03-07: 40 meq via ORAL
  Filled 2015-03-07: qty 2

## 2015-03-07 NOTE — Progress Notes (Signed)
Patient ambulated > 75 feet no equipment used.  Patient complain of CP while ambulating.

## 2015-03-07 NOTE — Progress Notes (Signed)
Discharge instructions and medications discussed with patient.  Prescriptions given to patient.  All questions answered.  

## 2015-03-07 NOTE — Discharge Instructions (Signed)
We have started you on a blood thinner called Xarelto (rivaroxaban) to treat your lung clot and reduce the risk of have clots in the future. You will have to take this twice a day for the first 3 weeks (with last day to take twice on October 28th, 2016) then take it once a day with food.  Pulmonary Embolism A pulmonary embolism (PE) is a sudden blockage or decrease of blood flow in one lung or both lungs. Most blockages come from a blood clot that travels from the legs or the pelvis to the lungs. PE is a dangerous and potentially life-threatening condition if it is not treated right away. CAUSES A pulmonary embolism occurs most commonly when a blood clot travels from one of your veins to your lungs. Rarely, PE is caused by air, fat, amniotic fluid, or part of a tumor traveling through your veins to your lungs. RISK FACTORS A PE is more likely to develop in:  People who smoke.  People who areolder, especially over 62 years of age.  People who are overweight (obese).  People who sit or lie still for a long time, such as during long-distance travel (over 4 hours), bed rest, hospitalization, or during recovery from certain medical conditions like a stroke.  People who do not engage in much physical activity (sedentary lifestyle).  People who have chronic breathing disorders.  People whohave a personal or family history of blood clots or blood clotting disease.  People whohave peripheral vascular disease (PVD), diabetes, or some types of cancer.  People who haveheart disease, especially if the person had a recent heart attack or has congestive heart failure.  People who have neurological diseases that affect the legs (leg paresis).  People who have had a traumatic injury, such as breaking a hip or leg.  People whohave recently had major or lengthy surgery, especially on the hip, knee, or abdomen.  People who have hada central line placed inside a large vein.  People who  takemedicines that contain the hormone estrogen. These include birth control pills and hormone replacement therapy.  Pregnancy or during childbirth or the postpartum period. SIGNS AND SYMPTOMS  The symptoms of a PE usually start suddenly and include:  Shortness of breath while active or at rest.  Coughing or coughing up blood or blood-tinged mucus.  Chest pain that is often worse with deep breaths.  Rapid or irregular heartbeat.  Feeling light-headed or dizzy.  Fainting.  Feelinganxious.  Sweating. There may also be pain and swelling in a leg if that is where the blood clot started. These symptoms may represent a serious problem that is an emergency. Do not wait to see if the symptoms will go away. Get medical help right away. Call your local emergency services (911 in the [place] Do not drive yourself to the hospital. DIAGNOSIS Your health care provider will take a medical history and perform a physical exam. You may also have other tests, including:  Blood tests to assess the clotting properties of your blood, assess oxygen levels in your blood, and find blood clots.  Imaging tests, such as CT, ultrasound, MRI, X-ray, and other tests to see if you have clots anywhere in your body.  An electrocardiogram (ECG) to look for heart strain from blood clots in the lungs. TREATMENT The main goals of PE treatment are:  To stop a blood clot from growing larger.  To stop new blood clots from forming. The type of treatment that you receive depends on many factors,  such as the cause of your PE, your risk for bleeding or developing more clots, and other medical conditions that you have. Sometimes, a combination of treatments is necessary. This condition may be treated with:  Medicines, including newer oral blood thinners (anticoagulants), warfarin, low molecular weight heparins, thrombolytics, or heparins.  Wearing compression stockings or using different types of devices.  Surgery  (rare) to remove the blood clot or to place a filter in your abdomen to stop the blood clot from traveling to your lungs. Treatments for a PE are often divided into immediate treatment, long-term treatment (up to 3 months after PE), and extended treatment (more than 3 months after PE). Your treatment may continue for several months. This is called maintenance therapy, and it is used to prevent the forming of new blood clots. You can work with your health care provider to choose the treatment program that is best for you. What are anticoagulants? Anticoagulants are medicines that treat PEs. They can stop current blood clots from growing and stop new clots from forming. They cannot dissolve existing clots. Your body dissolves clots by itself over time. Anticoagulants are given by mouth, by injection, or through an IV tube. What are thrombolytics? Thrombolytics are clot-dissolving medicines that are used to dissolve a PE. They carry a high risk of bleeding, so they tend to be used only in severe cases or if you have very low blood pressure. HOME CARE INSTRUCTIONS If you are taking a newer oral anticoagulant:  Take the medicine every single day at the same time each day.  Understand what foods and drugs interact with this medicine.  Understand that there are no regular blood tests required when using this medicine.  Understandthe side effects of this medicine, including excessive bruising or bleeding. Ask your health care provider or pharmacist about other possible side effects. If you are taking warfarin:  Understand how to take warfarin and know which foods can affect how warfarin works in Veterinary surgeon.  Understand that it is dangerous to taketoo much or too little warfarin. Too much warfarin increases the risk of bleeding. Too little warfarin continues to allow the risk for blood clots.  Follow your PT and INR blood testing schedule. The PT and INR results allow your health care provider to  adjust your dose of warfarin. It is very important that you have your PT and INR tested as often as told by your health care provider.  Avoid major changes in your diet, or tell your health care provider before you change your diet. Arrange a visit with a registered dietitian to answer your questions. Many foods, especially foods that are high in vitamin K, can interfere with warfarin and affect the PT and INR results. Eat a consistent amount of foods that are high in vitamin K, such as:  Spinach, kale, broccoli, cabbage, collard greens, turnip greens, Brussels sprouts, peas, cauliflower, seaweed, and parsley.  Beef liver and pork liver.  Green tea.  Soybean oil.  Tell your health care provider about any and all medicines, vitamins, and supplements that you take, including aspirin and other over-the-counter anti-inflammatory medicines. Be especially cautious with aspirin and anti-inflammatory medicines. Do not take those before you ask your health care provider if it is safe to do so. This is important because many medicines can interfere with warfarin and affect the PT and INR results.  Do not start or stop taking any over-the-counter or prescription medicine unless your health care provider or pharmacist tells you to do  so. If you take warfarin, you will also need to do these things:  Hold pressure over cuts for longer than usual.  Tell your dentist and other health care providers that you are taking warfarin before you have any procedures in which bleeding may occur.  Avoid alcohol or drink very small amounts. Tell your health care provider if you change your alcohol intake.  Do not use tobacco products, including cigarettes, chewing tobacco, and e-cigarettes. If you need help quitting, ask your health care provider.  Avoid contact sports. General Instructions  Take over-the-counter and prescription medicines only as told by your health care provider. Anticoagulant medicines can have  side effects, including easy bruising and difficulty stopping bleeding. If you are prescribed an anticoagulant, you will also need to do these things:  Hold pressure over cuts for longer than usual.  Tell your dentist and other health care providers that you are taking anticoagulants before you have any procedures in which bleeding may occur.  Avoid contact sports.  Wear a medical alert bracelet or carry a medical alert card that says you have had a PE.  Ask your health care provider how soon you can go back to your normal activities. Stay active to prevent new blood clots from forming.  Make sure to exercise while traveling or when you have been sitting or standing for a long period of time. It is very important to exercise. Exercise your legs by walking or by tightening and relaxing your leg muscles often. Take frequent walks.  Wear compression stockings as told by your health care provider to help prevent more blood clots from forming.  Do not use tobacco products, including cigarettes, chewing tobacco, and e-cigarettes. If you need help quitting, ask your health care provider.  Keep all follow-up appointments with your health care provider. This is important. PREVENTION Take these actions to decrease your risk of developing another PE:  Exercise regularly. For at least 30 minutes every day, engage in:  Activity that involves moving your arms and legs.  Activity that encourages good blood flow through your body by increasing your heart rate.  Exercise your arms and legs every hour during long-distance travel (over 4 hours). Drink plenty of water and avoid drinking alcohol while traveling.  Avoid sitting or lying in bed for long periods of time without moving your legs.  Maintain a weight that is appropriate for your height. Ask your health care provider what weight is healthy for you.  If you are a woman who is over 10 years of age, avoid unnecessary use of medicines that contain  estrogen. These include birth control pills.  Do not smoke, especially if you take estrogen medicines. If you need help quitting, ask your health care provider.  If you are at very high risk for PE, wear compression stockings.  If you recently had a PE, have regularly scheduled ultrasound testing on your legs to check for new blood clots. If you are hospitalized, prevention measures may include:  Early walking after surgery, as soon as your health care provider says that it is safe.  Receiving anticoagulants to prevent blood clots. If you cannot take anticoagulants, other options may be available, such as wearing compression stockings or using different types of devices. SEEK IMMEDIATE MEDICAL CARE IF:  You have new or increased pain, swelling, or redness in an arm or leg.  You have numbness or tingling in an arm or leg.  You have shortness of breath while active or at rest.  You have chest pain.  You have a rapid or irregular heartbeat.  You feel light-headed or dizzy.  You cough up blood.  You notice blood in your vomit, bowel movement, or urine.  You have a fever. These symptoms may represent a serious problem that is an emergency. Do not wait to see if the symptoms will go away. Get medical help right away. Call your local emergency services (911 in the [place] Do not drive yourself to the hospital.   This information is not intended to replace advice given to you by your health care provider. Make sure you discuss any questions you have with your health care provider.   Document Released: 05/11/2000 Document Revised: 02/02/2015 Document Reviewed: 09/08/2014 Elsevier Interactive Patient Education [employer].

## 2015-03-07 NOTE — Progress Notes (Signed)
Patient ID: [name], male   DOB: 1946-05-04, 69 y.o.   MRN: [id number]  Date: 03/07/2015  Patient name: Nicholas Farrell  Medical record number: [id number]  Date of birth: [birth year]   This patient's plan of care was discussed with the house staff. Please see their note for complete details. I concur with their findings. [name] is still having right-sided pleuritic chest pain but I expect this to last one to 2 more weeks given the size of his pulmonary infarction related to his recurrent pulmonary emboli. He will stay on rivaroxaban and we will arrange local followup. We will try to control his pain with tramadol. He should be ready for discharge soon.  Michel Bickers, MD 03/07/2015, 3:30 PM

## 2015-03-07 NOTE — Progress Notes (Signed)
Crawford Givens, SW unable to verify address to [facility].  Lorriane Shire will call [facility] in the am. Dr. Jonathon Bellows notified.

## 2015-03-07 NOTE — Discharge Summary (Addendum)
Name: Nicholas Farrell MRN: [id number] DOB: Jan 06, 1946 69 y.o. PCP: Provider Default, MD  Date of Admission: 03/04/2015  3:34 AM Date of Discharge: 03/08/2015 Attending Physician: Michel Bickers, MD  Discharge Diagnosis: 1. Acute pulmonary embolus Principal Problem:   Acute pulmonary embolus (HCC) Active Problems:   Hypertension   Polysubstance abuse   Diabetes mellitus type 2, uncontrolled (HCC)   Pleuritic chest pain   History of CVA (cerebrovascular accident)   History of MI (myocardial infarction)   Depression   Diastolic dysfunction   Hyperlipidemia  Discharge Medications:   Medication List    STOP taking these medications        aspirin 325 MG tablet  Replaced by:  aspirin 81 MG chewable tablet     citalopram 20 MG tablet  Commonly known as:  CELEXA      TAKE these medications        acetaminophen 325 MG tablet  Commonly known as:  TYLENOL  Take 2 tablets (650 mg total) by mouth every 6 (six) hours as needed for pain.     amLODipine 10 MG tablet  Commonly known as:  NORVASC  Take 1 tablet (10 mg total) by mouth daily.     aspirin 81 MG chewable tablet  Chew 1 tablet (81 mg total) by mouth daily.     escitalopram 10 MG tablet  Commonly known as:  LEXAPRO  Take 1 tablet (10 mg total) by mouth daily.     lisinopril 20 MG tablet  Commonly known as:  PRINIVIL,ZESTRIL  Take 1 tablet (20 mg total) by mouth daily.     metFORMIN 1000 MG tablet  Commonly known as:  GLUCOPHAGE  Take 1 tablet (1,000 mg total) by mouth 2 (two) times daily.     potassium chloride 10 MEQ tablet  Commonly known as:  K-DUR  Take 1 tablet (10 mEq total) by mouth daily.     Rivaroxaban 15 & 20 MG Tbpk  Commonly known as:  XARELTO STARTER PACK  Take as directed on package: Start with one 15mg  tablet by mouth twice a day with food. On Day 22, switch to one 20mg  tablet once a day with food.     traMADol 50 MG tablet  Commonly known as:  ULTRAM  Take 2 tablets (100 mg total) by  mouth every 12 (twelve) hours as needed for moderate pain.     traZODone 100 MG tablet  Commonly known as:  DESYREL  Take 100 mg by mouth at bedtime as needed for sleep.        Disposition and follow-up:   Mr.Leelynd Greenawalt was discharged from [facility] in Stable condition.  At the hospital follow up visit please address:  1.  Pulmonary Embolism: Patient was started on Xarelto 15 mg twice daily for 21 days (last day 03/25/15), then 20 mg daily indefinitely. It should be taken with food. Please make sure he has been compliant with this medication. Please also assess how his breathing, pain, and activity level has progressed.  Polysubstance abuse: Patient is discharged to [facility] for substance abuse in order to find help towards sobriety. Please assess whether how he has been doing in his progress. He has admitted to struggles with tobacco, alcohol, marijuana, and cocaine. His UDS was also positive for opiates, however specimen was collected after given opioid for pain management.  2.  Labs / imaging needed at time of follow-up: none  3.  Pending labs/ test needing follow-up: none  Follow-up Appointments: Follow-up Information    Follow up with [facility]. [facility] on 03/21/2015.   Why:  Appointment at 3:15pm. Please arrive at least 15 minutes prior with your medication.   Contact information:   [address] [place] [phone]      Discharge Instructions: Discharge Instructions    Call MD for:  difficulty breathing, headache or visual disturbances    Complete by:  As directed      Call MD for:  extreme fatigue    Complete by:  As directed      Call MD for:  persistant dizziness or light-headedness    Complete by:  As directed      Call MD for:  persistant nausea and vomiting    Complete by:  As directed      Call MD for:  severe uncontrolled pain    Complete by:  As directed      Call MD for:   temperature >100.4    Complete by:  As directed      Diet - low sodium heart healthy    Complete by:  As directed      Increase activity slowly    Complete by:  As directed            Consultations:    Procedures Performed:  Dg Chest 2 View  03/04/2015   CLINICAL DATA:  Shortness of breath and RIGHT chest pain beginning earlier today. History of hypertension, diabetes, head and neck cancer.  EXAM: CHEST  2 VIEW  COMPARISON:  PET CT Oct 23, 2012  FINDINGS: The cardiac silhouette is upper limits of normal in size, status post coronary artery stent. Mildly calcified aortic knob. Rounded consolidation RIGHT lung base, possible pleural based. Strandy densities bilateral lung bases. No pneumothorax. LEFT apical pleural thickening. Soft tissue planes and included osseous structures are nonsuspicious.  IMPRESSION: RIGHT lung base consolidation is possibly pleural. Followup PA and lateral chest X-ray is recommended in 3-4 weeks following trial of antibiotic therapy to ensure resolution and exclude underlying malignancy.  Bibasilar atelectasis.   Electronically Signed   By: Elon Alas M.D.   On: 03/04/2015 04:23   Ct Angio Chest Pe W/cm &/or Wo Cm  03/04/2015   CLINICAL DATA:  Patient having right sided chest pain, pleuritic in nature, with pain on inspiration. History of focal cord squamous cell carcinoma.  EXAM: CT ANGIOGRAPHY CHEST WITH CONTRAST  TECHNIQUE: Multidetector CT imaging of the chest was performed using the standard protocol during bolus administration of intravenous contrast. Multiplanar CT image reconstructions and MIPs were obtained to evaluate the vascular anatomy.  CONTRAST:  52mL OMNIPAQUE IOHEXOL 350 MG/ML SOLN  COMPARISON:  Current chest radiograph.  PET-CT, 10/23/2012.  FINDINGS: Angiographic study: There are bilateral pulmonary emboli. On the right, nonocclusive pulmonary embolus extends from the bronchus intermedius into the lower lobe segmental bronchi. This becomes occlusive  involving the anterior segmental pulmonary artery. This is associated with right lateral lower lobe peripheral consolidation suggestive of infarction. This accounts for the opacity noted on the current chest radiograph. There are nonocclusive pulmonary emboli extending into the other basilar segments. Right middle lobe segmental arteries are clear. There is a small nonocclusive embolus in the right upper lobe pulmonary artery. Nonocclusive pulmonary emboli are noted at the origin of the left upper lobe anterior segmental branch and left upper lobe lingular segmental branch. No other evidence of pulmonary infarction.  RV/LV ratio:  1.3  Thoracic inlet:  No mass or adenopathy. Visualized thyroid is unremarkable.  Mediastinum and hila: Heart is mildly enlarged. There are dense coronary artery calcifications. Great vessels are normal in caliber. No mediastinal or hilar masses or pathologically enlarged lymph nodes.  Lungs and pleura: There are additional areas of lung base opacity that most likely atelectasis. There are areas of centrilobular and paraseptal emphysema in the upper lobes, mild. No pulmonary edema. No pleural effusion or pneumothorax. No mass or suspicious nodule.  Limited upper abdomen:  Unremarkable.  Musculoskeletal:  No significant abnormality.  Review of the MIP images confirms the above findings.  IMPRESSION: 1. There are bilateral pulmonary emboli as detailed above. Positive for acute PE with CTevidence of right heart strain (RV/LV Ratio = 1.3) consistent with at least submassive (intermediate risk) PE. The presence of right heart strain has been associated with an increased risk of morbidity and mortality. These results were called by telephone at the time of interpretation on 03/04/2015 at 8:49 am to Nurse Rodman Key, who verbally acknowledged these results. 2. Area of pulmonary infarction noted laterally in the right lower lobe corresponding to the anterior segment. No other areas of pulmonary  infarction.  3. Lung base atelectasis.  No pulmonary edema.  4. Mild cardiomegaly.  Dense coronary artery calcifications.   Electronically Signed   By: Lajean Manes M.D.   On: 03/04/2015 08:52    2D Echo: 03/04/15 - Left ventricle: The cavity size was normal. Wall thickness was increased in a pattern of mild LVH. Systolic function was normal. The estimated ejection fraction was in the range of 60% to 65%. Doppler parameters are consistent with abnormal left ventricular relaxation (grade 1 diastolic dysfunction).  Cardiac Cath: n/a  Admission HPI: Cornell Bourbon is a 69 year old pleasant man with past medical history of hypertension, CAD s/p MI, CVA , PE no longer on AC, non-insulin dependent Type 2 DM, seizure disorder no longer on AED, laryngeal cancer s/p resection and radiation, and depression who presents with acute onset of right sided pleuritic chest pain.   He reports that he was feeling his normal self until 6 PM last night when he was riding on the bus to [place] from [place] for alcohol detoxication program at [facility] when he suddenly had sharp right sided chest pain with deep inspiration. He denies fever, chills, cough, hemoptysis, wheezing, LE edema/pain, weight loss, night sweating, nausea, vomiting, abdominal pain, or change in BM/urination. He reports having a pulmonary embolus at least 3-4 years ago with unclear cause and was on coumadin for approximately 8-12 months. He is unsure why he is no longer on it. 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 He reports he is ambulatory and denies recent long travel.  He continues to smoke cigarettes about 0.5 pack per day, drink alcohol (which he has relapsed), and crack cocaine (last use 5 days ago). He denies family history of cancer or clotting disorder.   Hospital Course by problem list: Principal Problem:   Acute pulmonary embolus  ([place]) Active Problems:   Hypertension   Polysubstance abuse   Diabetes mellitus type 2, uncontrolled ([place])   Pleuritic chest pain   History of CVA (cerebrovascular accident)   History of MI (myocardial infarction)   Depression   Diastolic dysfunction   Hyperlipidemia   Acute Pulmonary Embolus: Patient arrived with symptoms of sharp right sided chest pain with deep inspiration. Initial CXR in the ED revealed right lung base consolidation  with initial concern for pneumonia. However further imaging with CT Angio showed bilateral pulmonary emboli with occlusion on the right side causing right lower lobe pulmonary infarct in the anterior segment. Imaging showed evidence of right heart strain and patient was started on IV unfractionated heparin rather than Lovenox due to possible need for thrombolytics. Echo was done which done not read right heart strain. Patient had been satting above 92% on room air and not tachycardic. He was hemodynamically stable. Pain management was started with IV opioids. Patient was then transitioned to Xarelto (Rivaroxaban) for anticoagulation. He was then transitioned to po pain medication.   Polysubstance abuse: Patient with history of substance abuse who says he was 2 years sober before relapsing 3 weeks prior to admission with alcohol, marijuana, and crack cocaine. He was on his way from [place] to [place] to attend the [facility] for substance abuse. His UDS was also positive for opiates, but specimen was collected after he was given opioids for pain medication. He had been drinking a pint of liquor with last drink 4-5 prior. He had no withdrawal symptoms or tremors, hallucinations, or anxiety/agitation during his stay. CIWA was 0 the entire hospital visit.   Discharge Vitals:   BP 125/58 mmHg  Pulse 80  Temp(Src) 98.2 F (36.8 C) (Oral)  Resp 18  Ht 5\' 9"  (1.753 m)  Wt 194 lb (87.998 kg)  BMI 28.64 kg/m2  SpO2 95%  Discharge Labs:  Results for orders  placed or performed during the hospital encounter of 03/04/15 (from the past 24 hour(s))  Glucose, capillary     Status: Abnormal   Collection Time: 03/06/15  8:51 PM  Result Value Ref Range   Glucose-Capillary 165 (H) 65 - 99 mg/dL  Basic metabolic panel     Status: Abnormal   Collection Time: 03/07/15  5:26 AM  Result Value Ref Range   Sodium 137 135 - 145 mmol/L   Potassium 3.3 (L) 3.5 - 5.1 mmol/L   Chloride 101 101 - 111 mmol/L   CO2 26 22 - 32 mmol/L   Glucose, Bld 128 (H) 65 - 99 mg/dL   BUN 8 6 - 20 mg/dL   Creatinine, Ser 0.96 0.61 - 1.24 mg/dL   Calcium 8.6 (L) 8.9 - 10.3 mg/dL   GFR calc non Af Amer >60 >60 mL/min   GFR calc Af Amer >60 >60 mL/min   Anion gap 10 5 - 15  Glucose, capillary     Status: Abnormal   Collection Time: 03/07/15  7:46 AM  Result Value Ref Range   Glucose-Capillary 143 (H) 65 - 99 mg/dL  Glucose, capillary     Status: Abnormal   Collection Time: 03/07/15 12:37 PM  Result Value Ref Range   Glucose-Capillary 152 (H) 65 - 99 mg/dL  Glucose, capillary     Status: Abnormal   Collection Time: 03/07/15  4:24 PM  Result Value Ref Range   Glucose-Capillary 115 (H) 65 - 99 mg/dL   Comment 1 Notify RN    Comment 2 Document in Chart     Signed: Zada Finders, MD 03/07/2015, 4:43 PM    Services Ordered on Discharge: none Equipment Ordered on Discharge: none

## 2015-03-07 NOTE — Clinical Social Work Note (Signed)
Patient was to discharge to [facility] today. CSW did not confirm address with [facility] staff earlier today and unable to reach anyone at the phone numbers called. Nurse and patient informed. Patient's nurse will inform MD.   Coleton Woon Givens, MSW, LCSW Licensed Clinical Social Worker [place] [phone]

## 2015-03-07 NOTE — Progress Notes (Signed)
   Subjective: Patient says his pain is a little better, but rates it 7/10 at this time. He does still plan to join the [facility] after discharge.  Objective: Vital signs in last 24 hours: Filed Vitals:   03/06/15 1724 03/06/15 1942 03/06/15 2050 03/07/15 0528  BP: 132/59 131/61 131/61 136/61  Pulse: 64 60 65 73  Temp: 99.1 F (37.3 C) 98.4 F (36.9 C) 98.4 F (36.9 C) 99 F (37.2 C)  TempSrc: Oral Oral Oral Oral  Resp: 18 18 18 17   Height:  5\' 9"  (1.753 m)    Weight:  194 lb (87.998 kg)    SpO2: 97% 96% 97% 92%   Weight change:   Intake/Output Summary (Last 24 hours) at 03/07/15 0912 Last data filed at 03/07/15 0600  Gross per 24 hour  Intake    480 ml  Output      0 ml  Net    480 ml   General: resting in bed, some distress Cardiac: RRR, no murmurs or gallops Pulm: shallow breathing, CTA Abd: soft, nontender, nondistended, BS present Ext: warm and well perfused, no pedal edema   Assessment/Plan: Principal Problem:   Acute pulmonary embolus (HCC) Active Problems:   Hypertension   Polysubstance abuse   Diabetes mellitus type 2, uncontrolled (HCC)   Pleuritic chest pain   History of CVA (cerebrovascular accident)   History of MI (myocardial infarction)   Depression   Diastolic dysfunction   Hyperlipidemia  Acute pulmonary emboli bilaterally with RLL pulmonary infarct: Patient hemodynamically stable, although he still continues to have some pain on right lateral chest wall which interferes with deep inspiration. He is now on PO medicine for pain, mainly Tylenol and Oxycodone IR for breakthrough severe pain. He received Oxycodone twice yesterday, with last dose at 21:27. On anticoagulation with Xarelto. -Continue Xeralto 15 mg BID for 21 day course (Start 10/8, Last Day 10/28), then 20 mg once daily indefinitely -Pain management with Tylenol q4h PRN for mild-mod pain, Oxycodone q4h prn for severe pain -He will not be able to have narcotics for pain at the [place] abuse: Patient with history of substance abuse who says he was 2 years sober before relapsing 3 weeks prior to hospital visit with alcohol, marijuana, and crack cocaine. He was on his way to [facility] for substance abuse before admission. He has been drinking a pint of liquor with last drink 4-5 days ago. He has no withdrawal symptoms of tremors, hallucinations, or anxiety/agitation at this time. CIWA has been 0 so far.  -Continue with plans at [facility], will discuss with CSW on transition after discharge -Can set up to f/u with our clinic for care while in [place] not be able to have narcotic medication for pain management after discharge  HTN: Blood pressures under control now on his home Amlodipine and Lisinopril -Continue Amlodipine 10 mg daily and Lisinopril 10 mg daily  Dispo: Disposition is deferred at this time, awaiting improvement of current medical problems.      Zada Finders, MD 03/07/2015, 9:12 AM

## 2015-03-08 DIAGNOSIS — J159 Unspecified bacterial pneumonia: Secondary | ICD-10-CM | POA: Diagnosis not present

## 2015-03-08 LAB — GLUCOSE, CAPILLARY: Glucose-Capillary: 92 mg/dL (ref 65–99)

## 2015-03-08 NOTE — Progress Notes (Signed)
Ambulated the patient downstairs per Md order. Patient insisted on smoking one last time. Discouraged but patient smoked a half of cigarette despite be asked not to. He become dizzy after a few puffs. He was wheeled back upstairs. Informed patient it was not safe to ambulate downstairs during remainder of the shift. Dizziness resolved once he returned to the unit. Will continue to monitor for safety.

## 2015-03-08 NOTE — Care Management Note (Signed)
Case Management Note  Patient Details  Name: Nicholas Farrell MRN: [id number] Date of Birth: 1945-06-06  Subjective/Objective:          CM following for progression and d/c planning.          Action/Plan: Pt for d/c , Xarelto card for 30 day supply free given to pt by case manager. Pt has Medicare and Medicaid insurance therefore should be able to afford his medications after d/c. Pt is ambulatory and ready for d/c. Pt had planned to go directly to [facility] for rehab program. This CM spoke with program coordinator who is concerned about his ability to perform his work as part of the rehab program, he will speak with the pt today prior to d/c . This CM discussed d/c instructions with Nicholas Farrell.  CSW following this pt and assisting with d/c needs.   Expected Discharge Date:      03/08/2015            Expected Discharge Plan:  Home/Self Care  In-House Referral:     Discharge planning Services  CM Consult, Medication Assistance  Xarelto card for 30 day free supply given to pt .   Post Acute Care Choice:    Choice offered to:     DME Arranged:    DME Agency:     HH Arranged:    HH Agency:     Status of Service:  Completed, signed off  Medicare Important Message Given:    Date Medicare IM Given:    Medicare IM give by:    Date Additional Medicare IM Given:    Additional Medicare Important Message give by:     If discussed at [place] of Stay Meetings, dates discussed:    Additional Comments:  Adron Bene, RN 03/08/2015, 10:26 AM

## 2015-03-08 NOTE — Progress Notes (Signed)
   Subjective: Patient says he is doing better, feels ready to leave the hospital. Did not leave yesterday as unable to verify [place] address on discharge. Objective: Vital signs in last 24 hours: Filed Vitals:   03/07/15 0830 03/07/15 1827 03/07/15 2100 03/08/15 0431  BP: 125/58 134/62 143/62 126/57  Pulse: 80 77 72 64  Temp: 98.2 F (36.8 C) 98 F (36.7 C) 97.9 F (36.6 C) 98.2 F (36.8 C)  TempSrc: Oral Oral Oral Oral  Resp: 18 18 20 18   Height:      Weight:   194 lb 7.1 oz (88.2 kg)   SpO2: 95% 96% 93% 96%   Weight change: 7.1 oz (0.202 kg)  Intake/Output Summary (Last 24 hours) at 03/08/15 0834 Last data filed at 03/08/15 0559  Gross per 24 hour  Intake    855 ml  Output    450 ml  Net    405 ml   General: resting in bed Cardiac: RRR, no murmurs or gallops Pulm: shallow breathing, CTA Abd: soft, nontender, nondistended, BS present Ext: warm and well perfused, no pedal edema   Assessment/Plan: Principal Problem:   Acute pulmonary embolus (HCC) Active Problems:   Hypertension   Polysubstance abuse   Diabetes mellitus type 2, uncontrolled (HCC)   Pleuritic chest pain   History of CVA (cerebrovascular accident)   History of MI (myocardial infarction)   Depression   Diastolic dysfunction   Hyperlipidemia  Acute pulmonary emboli bilaterally with RLL pulmonary infarct: Patient hemodynamically stable, pain is improving. He will likely continue to have some pain for the next week or two. On anticoagulation with Xarelto. -Continue Xeralto 15 mg BID for 21 day course (Start 10/8, Last Day 10/28), then 20 mg once daily indefinitely -Pain management with short-course Tramadol on discharge. NSAIDs can be used cautiously with Xarelto with monitoring for obvious bleeding or melena -will follow up with [place] in [facility]  Polysubstance abuse: Patient with history of substance abuse who says he was 2 years sober before relapsing 3 weeks prior to hospital visit with alcohol,  marijuana, and crack cocaine. He was on his way to [facility] for substance abuse before admission.  He has had no withdrawal symptoms of tremors, hallucinations, or anxiety/agitation during hospital stay. CIWA has been 0 so far.  -Continue with discharge plans to [facility], with f/u in our clinic   HTN: Blood pressures under control now on his home Amlodipine and Lisinopril -Continue Amlodipine 10 mg daily and Lisinopril 10 mg daily  Dispo: Discharge today to [facility].      Zada Finders, MD 03/08/2015, 8:34 AM

## 2015-03-08 NOTE — Progress Notes (Signed)
Patient Discharge:  Disposition: Pt discharged to [facility]  Education: Pt educated on all discharge instructions. Pt verablized understanding  IV: Removed  Telemetry: Removed  Follow-up appointments: Reviewed with pt  Prescriptions: Scripts given to pt.  Transportation: Transported by [facility].  Belongings: All belongings taken with pt.

## 2015-03-21 ENCOUNTER — Encounter: Payer: Self-pay | Admitting: Internal Medicine

## 2015-03-21 ENCOUNTER — Ambulatory Visit (INDEPENDENT_AMBULATORY_CARE_PROVIDER_SITE_OTHER): Payer: Medicare Other | Admitting: Internal Medicine

## 2015-03-21 VITALS — BP 141/61 | HR 64 | Temp 98.9°F | Ht 69.0 in | Wt 203.1 lb

## 2015-03-21 DIAGNOSIS — F17200 Nicotine dependence, unspecified, uncomplicated: Secondary | ICD-10-CM | POA: Diagnosis not present

## 2015-03-21 DIAGNOSIS — Z7982 Long term (current) use of aspirin: Secondary | ICD-10-CM | POA: Diagnosis not present

## 2015-03-21 DIAGNOSIS — C329 Malignant neoplasm of larynx, unspecified: Secondary | ICD-10-CM

## 2015-03-21 DIAGNOSIS — I2510 Atherosclerotic heart disease of native coronary artery without angina pectoris: Secondary | ICD-10-CM

## 2015-03-21 DIAGNOSIS — E119 Type 2 diabetes mellitus without complications: Secondary | ICD-10-CM

## 2015-03-21 DIAGNOSIS — Z7901 Long term (current) use of anticoagulants: Secondary | ICD-10-CM

## 2015-03-21 DIAGNOSIS — Z7984 Long term (current) use of oral hypoglycemic drugs: Secondary | ICD-10-CM | POA: Diagnosis not present

## 2015-03-21 DIAGNOSIS — F1221 Cannabis dependence, in remission: Secondary | ICD-10-CM

## 2015-03-21 DIAGNOSIS — Z09 Encounter for follow-up examination after completed treatment for conditions other than malignant neoplasm: Secondary | ICD-10-CM | POA: Diagnosis not present

## 2015-03-21 DIAGNOSIS — Z8521 Personal history of malignant neoplasm of larynx: Secondary | ICD-10-CM

## 2015-03-21 DIAGNOSIS — F329 Major depressive disorder, single episode, unspecified: Secondary | ICD-10-CM

## 2015-03-21 DIAGNOSIS — IMO0001 Reserved for inherently not codable concepts without codable children: Secondary | ICD-10-CM

## 2015-03-21 DIAGNOSIS — E785 Hyperlipidemia, unspecified: Secondary | ICD-10-CM

## 2015-03-21 DIAGNOSIS — I2609 Other pulmonary embolism with acute cor pulmonale: Secondary | ICD-10-CM

## 2015-03-21 DIAGNOSIS — I10 Essential (primary) hypertension: Secondary | ICD-10-CM

## 2015-03-21 DIAGNOSIS — F1921 Other psychoactive substance dependence, in remission: Secondary | ICD-10-CM | POA: Diagnosis not present

## 2015-03-21 DIAGNOSIS — F32A Depression, unspecified: Secondary | ICD-10-CM

## 2015-03-21 DIAGNOSIS — F1021 Alcohol dependence, in remission: Secondary | ICD-10-CM | POA: Diagnosis not present

## 2015-03-21 DIAGNOSIS — F1421 Cocaine dependence, in remission: Secondary | ICD-10-CM

## 2015-03-21 DIAGNOSIS — F1910 Other psychoactive substance abuse, uncomplicated: Secondary | ICD-10-CM

## 2015-03-21 DIAGNOSIS — E1165 Type 2 diabetes mellitus with hyperglycemia: Secondary | ICD-10-CM

## 2015-03-21 MED ORDER — ATORVASTATIN CALCIUM 40 MG PO TABS: 40.0000 mg | ORAL_TABLET | Freq: Every day | ORAL | Status: AC

## 2015-03-21 MED ORDER — ONETOUCH DELICA LANCETS FINE MISC: Status: AC

## 2015-03-21 MED ORDER — GLUCOSE BLOOD VI STRP: ORAL_STRIP | Status: DC

## 2015-03-21 NOTE — Patient Instructions (Signed)
-   Start taking Atorvastatin 40 mg daily. Stop taking your Simvastatin. - Please check your blood sugars twice daily until your next appointment.  - Follow up in 3 months  General Instructions:   Please bring your medicines with you each time you come to clinic.  Medicines may include prescription medications, over-the-counter medications, herbal remedies, eye drops, vitamins, or other pills.   Progress Toward Treatment Goals:  No flowsheet data found.  Self Care Goals & Plans:  Self Care Goal 03/21/2015  Manage my medications take my medicines as prescribed; bring my medications to every visit; refill my medications on time; follow the sick day instructions if I am sick  Monitor my health check my feet daily  Eat healthy foods eat more vegetables; eat fruit for snacks and desserts; eat baked foods instead of fried foods; eat smaller portions  Be physically active find an activity I enjoy    No flowsheet data found.   Care Management & Community Referrals:  No flowsheet data found.

## 2015-03-21 NOTE — Assessment & Plan Note (Signed)
BP Readings from Last 3 Encounters:  03/21/15 141/61  03/08/15 160/67  09/19/12 140/64    Lab Results  Component Value Date   NA 137 03/07/2015   K 3.3* 03/07/2015   CREATININE 0.96 03/07/2015    Assessment: Blood pressure control:  Acceptable.   Plan: Medications:  Continue Amlodipine 10 mg daily and Lisinopril 20 mg daily.  Educational resources provided: brochure, handout, video Other plans:  - BP recheck in 1 month

## 2015-03-21 NOTE — Assessment & Plan Note (Signed)
Continue Xarelto 15 mg daily currently. Will switch to 20 mg daily on 10/28.

## 2015-03-21 NOTE — Assessment & Plan Note (Signed)
Patient is 2 weeks sober. I congratulated him on this and encouraged him to continue drug abstinence.

## 2015-03-21 NOTE — Assessment & Plan Note (Addendum)
No anginal symptoms. Continue ASA and Lisinopril. Will switch to Atorvastatin 40 mg daily. Likely not on beta blocker due to cocaine abuse.

## 2015-03-21 NOTE — Assessment & Plan Note (Signed)
Stable.  Continue Lexapro 10 mg daily. 

## 2015-03-21 NOTE — Assessment & Plan Note (Signed)
With his hx of diabetes, CAD, and prior stroke he should be on high intensity statin therapy. - Switch to Atorvastatin 40 mg daily

## 2015-03-21 NOTE — Assessment & Plan Note (Signed)
In remission. Follows with rad-onc in [place].

## 2015-03-21 NOTE — Assessment & Plan Note (Addendum)
Lab Results  Component Value Date   HGBA1C 7.1* 03/04/2015   HGBA1C 15.9* 06/19/2012     Assessment: Diabetes control:  Much better control recently per HbA1c level. However, he reported blurry vision today. I discussed with him that I would like for him to check his blood sugar for the next month to see how his blood sugars are doing only on Metformin. He has been on insulin therapy previously.    Plan: Medications:  Continue Metformin 1000 mg BID Home glucose monitoring: Frequency:  Twice daily Timing:  Breakfast and dinner  Instruction/counseling given: reminded to bring blood glucose meter & log to each visit, reminded to bring medications to each visit and discussed diet Educational resources provided: brochure, handout Other plans:  - f/u in 1 month to assess blood sugars. If doing well on Metformin only then can scale back blood glucose testing.  - Patient provided glucometer and prescription for test strips and lancets

## 2015-03-21 NOTE — Progress Notes (Signed)
   Subjective:    Patient ID: Nicholas Farrell, male    DOB: 07-29-1945, 69 y.o.   MRN: [id number]  HPI Nicholas Farrell is a 69yo man with PMHx of HTN, type 2 DM, depression, and polysubstance abuse who presents today for hospital follow up and to establish care.  Pulmonary Embolism: Patient was hospitalized from 10/7-10/11 for right-sided chest pain and SOB and was found to have bilateral pulmonary emboli with right heart strain. He was discharged on Xarelto and reports compliance with this medication. He reports he has been doing well since discharge. He denies chest pain and SOB. He notes occasionally if he blows his nose a small amount of blood will be present on the tissue. He notes the bleeding is minimal and is easily stopped.   HTN: BP controlled at 141/61 today. He takes Amlodipine 10 mg daily and Lisinopril 20 mg daily.   Type 2 DM: Last HbA1c 7.1 on 10/7 but was 15.9 previously. He takes Metformin 1000 mg BID. He reports blurry vision today. He does not have a glucometer at home.   Depression: He reports his symptoms are well controlled on Lexapro 10 mg daily. He reports he is sleeping well and occasionally takes Trazadone to help him sleep. He reports good appetite.   CAD: He had a cardiac cath in 2012 with a stent placed to the RCA. He is on ASA 81 mg daily and Lisinopril. He is not on a beta blocker. Denies any anginal symptoms.   Hx Laryngeal Cancer: s/p resection and radiation in 2014. He follows with a radiation oncologist in [place] where he is from.  Hyperlipidemia: LDL 100 on 10/7. He is currently on Simvastatin 20 mg daily.   Polysubstance Abuse: Patient currently in rehab at [facility]. He reports he was sober for 2 years and then recently relapsed. He reports abusing tobacco, marijuana, crack cocaine, and alcohol. His UDS was positive for opiates, benzos, and cocaine during his hospitalization. He reports he has been sober for 2 weeks now. He states he will only be in  [place] for the next 5-6 months while he completes this drug rehab program.   Review of Systems General: Denies fever, chills, night sweats, changes in weight, changes in appetite HEENT: Denies headaches, ear pain, changes in vision, rhinorrhea, sore throat CV: Denies CP, palpitations, SOB, orthopnea Pulm: Denies SOB, cough, wheezing GI: Denies abdominal pain, nausea, vomiting, diarrhea, constipation, melena, hematochezia GU: Denies dysuria, hematuria, frequency Msk: Denies muscle cramps, joint pains Neuro: Denies weakness, numbness, tingling Skin: Denies rashes, bruising Psych: Denies anxiety, hallucinations    Objective:   Physical Exam General: alert, sitting up, NAD HEENT: [place]/AT. EOMI. Blind in R eye. Mucus membranes moist. CV: RRR, no m/g/r Pulm: CTA bilaterally, breaths non-labored Abd: BS+, soft, non-tender Ext: warm, no peripheral edema  Neuro: alert and oriented x 3    Assessment & Plan:  Please refer to A&P documentation.

## 2015-03-22 NOTE — Progress Notes (Signed)
Case discussed with Dr. Rivet at time of visit.  We reviewed the resident's history and exam and pertinent patient test results.  I agree with the assessment, diagnosis, and plan of care documented in the resident's note. 

## 2015-03-30 ENCOUNTER — Other Ambulatory Visit: Payer: Self-pay | Admitting: *Deleted

## 2015-03-30 DIAGNOSIS — E1165 Type 2 diabetes mellitus with hyperglycemia: Principal | ICD-10-CM

## 2015-03-30 DIAGNOSIS — IMO0001 Reserved for inherently not codable concepts without codable children: Secondary | ICD-10-CM

## 2015-03-30 MED ORDER — GLUCOSE BLOOD VI STRP: ORAL_STRIP | Status: AC

## 2015-04-27 ENCOUNTER — Other Ambulatory Visit: Payer: Self-pay | Admitting: Internal Medicine

## 2015-04-28 ENCOUNTER — Other Ambulatory Visit: Payer: Self-pay | Admitting: Internal Medicine

## 2015-04-28 DIAGNOSIS — I2609 Other pulmonary embolism with acute cor pulmonale: Secondary | ICD-10-CM

## 2015-04-28 MED ORDER — RIVAROXABAN 20 MG PO TABS: 20.0000 mg | ORAL_TABLET | Freq: Every day | ORAL | Status: AC

## 2015-04-28 NOTE — Telephone Encounter (Signed)
Patient no longer needs starter pack. Should be on Xarelto 20 mg daily. I sent in for this prescription.

## 2015-08-18 ENCOUNTER — Emergency Department (HOSPITAL_COMMUNITY)
Admission: EM | Admit: 2015-08-18 | Discharge: 2015-08-18 | Disposition: A | Discharge status: Home / Self Care | Payer: Medicare Other | Attending: Emergency Medicine | Admitting: Emergency Medicine

## 2015-08-18 ENCOUNTER — Encounter (HOSPITAL_COMMUNITY): Payer: Self-pay | Admitting: Emergency Medicine

## 2015-08-18 DIAGNOSIS — I252 Old myocardial infarction: Secondary | ICD-10-CM | POA: Insufficient documentation

## 2015-08-18 DIAGNOSIS — I10 Essential (primary) hypertension: Secondary | ICD-10-CM | POA: Insufficient documentation

## 2015-08-18 DIAGNOSIS — R21 Rash and other nonspecific skin eruption: Secondary | ICD-10-CM | POA: Diagnosis present

## 2015-08-18 DIAGNOSIS — Z87891 Personal history of nicotine dependence: Secondary | ICD-10-CM | POA: Insufficient documentation

## 2015-08-18 DIAGNOSIS — F329 Major depressive disorder, single episode, unspecified: Secondary | ICD-10-CM | POA: Insufficient documentation

## 2015-08-18 DIAGNOSIS — E119 Type 2 diabetes mellitus without complications: Secondary | ICD-10-CM | POA: Diagnosis not present

## 2015-08-18 DIAGNOSIS — Z7984 Long term (current) use of oral hypoglycemic drugs: Secondary | ICD-10-CM | POA: Diagnosis not present

## 2015-08-18 DIAGNOSIS — Z8673 Personal history of transient ischemic attack (TIA), and cerebral infarction without residual deficits: Secondary | ICD-10-CM | POA: Diagnosis not present

## 2015-08-18 DIAGNOSIS — B372 Candidiasis of skin and nail: Secondary | ICD-10-CM | POA: Insufficient documentation

## 2015-08-18 DIAGNOSIS — Z79899 Other long term (current) drug therapy: Secondary | ICD-10-CM | POA: Insufficient documentation

## 2015-08-18 DIAGNOSIS — I2510 Atherosclerotic heart disease of native coronary artery without angina pectoris: Secondary | ICD-10-CM | POA: Diagnosis not present

## 2015-08-18 MED ORDER — CLOTRIMAZOLE 1 % EX CREA: TOPICAL_CREAM | CUTANEOUS | Status: AC

## 2015-08-18 NOTE — ED Notes (Signed)
Patient states abrasion or scratch on head of penis that came up on Tuesday.  Patient states no STD's.   Patient states he is uncircumsized and "places just come up".  States he is leaving [place] soon and doesn't want to go home with it there, because he is married. 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

## 2015-08-18 NOTE — Discharge Instructions (Signed)
Cutaneous Candidiasis °Cutaneous candidiasis is a condition in which there is an overgrowth of yeast (candida) on the skin. Yeast normally live on the skin, but in small enough numbers not to cause any symptoms. In certain cases, increased growth of the yeast may cause an actual yeast infection. This kind of infection usually occurs in areas of the skin that are constantly warm and moist, such as the armpits or the groin. Yeast is the most common cause of diaper rash in babies and in people who cannot control their bowel movements (incontinence). °CAUSES  °The fungus that most often causes cutaneous candidiasis is Candida albicans. Conditions that can increase the risk of getting a yeast infection of the skin include: °· Obesity. °· Pregnancy. °· Diabetes. °· Taking antibiotic medicine. °· Taking birth control pills. °· Taking steroid medicines. °· Thyroid disease. °· An iron or zinc deficiency. °· Problems with the immune system. °SYMPTOMS  °· Red, swollen area of the skin. °· Bumps on the skin. °· Itchiness. °DIAGNOSIS  °The diagnosis of cutaneous candidiasis is usually based on its appearance. Light scrapings of the skin may also be taken and viewed under a microscope to identify the presence of yeast. °TREATMENT  °Antifungal creams may be applied to the infected skin. In severe cases, oral medicines may be needed.  °HOME CARE INSTRUCTIONS  °· Keep your skin clean and dry. °· Maintain a healthy weight. °· If you have diabetes, keep your blood sugar under control. °SEEK IMMEDIATE MEDICAL CARE IF: °· Your rash continues to spread despite treatment. °· You have a fever, chills, or abdominal pain. °  °This information is not intended to replace advice given to you by your health care provider. Make sure you discuss any questions you have with your health care provider. °  °Document Released: 01/30/2011 Document Revised: 08/06/2011 Document Reviewed: 11/15/2014 °Elsevier Interactive Patient Education ©2016 [employer]  Inc[employer] ° °

## 2015-08-18 NOTE — ED Provider Notes (Signed)
CSN: [id number]     Arrival date & time 08/18/15  [id number] History  By signing my name below, I, Stephania Fragmin, attest that this documentation has been prepared under the direction and in the presence of Charlann Lange, PA-C. Electronically Signed: Stephania Fragmin, ED Scribe. 08/18/2015. 11:37 AM.    Chief Complaint  Patient presents with  . penis problem    Patient is a 70 y.o. male presenting with rash. The history is provided by the patient. No language interpreter was used.  Rash Location:  Ano-genital Ano-genital rash location:  Penis Quality: redness   Severity:  Mild Onset quality:  Sudden Duration:  2 days Timing:  Constant Progression:  Worsening Chronicity:  New Relieved by:  None tried Worsened by:  Nothing tried Ineffective treatments:  None tried  HPI Comments: Nicholas Farrell is a 70 y.o. male with a history of hypertension, CAD, seizures, cancer, and NIDDM, who presents to the Emergency Department complaining of a gradual-onset, constant, gradually worsening rash on the head of his penis that began 2 days ago. Patient states he is going to leave the [facility] soon and wanted to get it checked out before going home. He denies any known exposure to STD's. He denies foreskin swelling, pain with foreskin retraction, dysuria, penile discharge, or difficulty urinating.  Past Medical History  Diagnosis Date  . Hypertension   . CAD (coronary artery disease)   . Diverticulosis   . Seizures ([place])     20 yrs ago, off Dilantin for 5 yrs  . Stroke [facility]) 2011    no residual  . Myocardial infarction ([place])   . Depression   . 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 Cancer ([place])     glottis cancer/vocal cord  . Diabetes mellitus     TYPE 2, takes metformin(03/04/15)   Past Surgical History  Procedure Laterality Date  . Mouth surgery      cut bone out of upper and lower jaw to prepare for dentures  . Coronary angioplasty with stent placement    . Microlaryngoscopy  09/18/2012  .  Microlaryngoscopy Left 09/18/2012    Procedure: MICROLARYNGOSCOPY;  Surgeon: Melissa Montane, MD;  Location: [facility] OR;  Service: ENT;  Laterality: Left;  MICROLARYNGOSCOPY WITH REMOVAL OF VOCAL CORD POLYP   No family history on file. Social History  Substance Use Topics  . Smoking status: Former Smoker    Quit date: 09/24/2011  . Smokeless tobacco: Never Used  . Alcohol Use: No    Review of Systems  Genitourinary: Negative for dysuria, penile swelling, difficulty urinating and penile pain.  Skin: Positive for rash.   Allergies  Review of patient's allergies indicates no known allergies.  Home Medications   Prior to Admission medications   Medication Sig Start Date End Date Taking? Authorizing Provider  amLODipine (NORVASC) 10 MG tablet Take 1 tablet (10 mg total) by mouth daily. 09/10/12  Yes Harden Mo, MD  lisinopril (PRINIVIL,ZESTRIL) 20 MG tablet Take 1 tablet (20 mg total) by mouth daily. 08/27/12  Yes Sherren Mocha, MD  metFORMIN (GLUCOPHAGE) 1000 MG tablet Take 1 tablet (1,000 mg total) by mouth 2 (two) times daily. 09/10/12  Yes Harden Mo, MD  acetaminophen (TYLENOL) 325 MG tablet Take 2 tablets (650 mg total) by mouth every 6 (six) hours as needed for pain. Patient not taking: Reported on 03/04/2015 09/10/12   Harden Mo, MD  aspirin 81 MG chewable tablet Chew 1 tablet (81 mg total)  by mouth daily. 03/07/15   Zada Finders, MD  atorvastatin (LIPITOR) 40 MG tablet Take 1 tablet (40 mg total) by mouth daily. 03/21/15   Carly Montey Hora, MD  escitalopram (LEXAPRO) 10 MG tablet Take 1 tablet (10 mg total) by mouth daily. 09/10/12   Harden Mo, MD  glucose blood [facility] VERIO) test strip Use to check blood sugar 1 to 2 times daily. diag code E11.65. Non insulin dependent 03/30/15   Juliet Rude, MD  ONETOUCH DELICA LANCETS FINE MISC Use 1 lancet per glucose check 03/21/15   Juliet Rude, MD  potassium chloride (K-DUR) 10 MEQ tablet Take 1 tablet (10 mEq total) by mouth daily.  08/28/12   Sherren Mocha, MD  rivaroxaban (XARELTO) 20 MG TABS tablet Take 1 tablet (20 mg total) by mouth daily with supper. 04/28/15   Carly Montey Hora, MD  traMADol (ULTRAM) 50 MG tablet Take 2 tablets (100 mg total) by mouth every 12 (twelve) hours as needed for moderate pain. 03/07/15   Zada Finders, MD  traZODone (DESYREL) 100 MG tablet Take 100 mg by mouth at bedtime as needed for sleep.    Historical Provider, MD   BP 141/67 mmHg  Pulse 78  Temp(Src) 98.2 F (36.8 C) (Oral)  Resp 16  Ht 5\' 9"  (1.753 m)  Wt 212 lb (96.163 kg)  BMI 31.29 kg/m2  SpO2 97% Physical Exam  Constitutional: He is oriented to person, place, and time. He appears well-developed and well-nourished. No distress.  HENT:  Head: Normocephalic and atraumatic.  Eyes: Conjunctivae and EOM are normal.  Neck: Neck supple. No tracheal deviation present.  Cardiovascular: Normal rate.   Pulmonary/Chest: Effort normal. No respiratory distress.  Genitourinary:  Uncircumcised. No foreskin swelling. Easy foreskin retraction. Head of the penis slightly red, irritated, consistent with fungal infection.  Musculoskeletal: Normal range of motion.  Neurological: He is alert and oriented to person, place, and time.  Skin: Skin is warm and dry.  Psychiatric: He has a normal mood and affect. His behavior is normal.  Nursing note and vitals reviewed.   ED Course  Procedures (including critical care time)  DIAGNOSTIC STUDIES: Oxygen Saturation is 97% on RA, normal by my interpretation.    COORDINATION OF CARE: 10:35 AM - Suspect fungal growth. Discussed treatment plan with pt at bedside which includes keeping area clean and anti-fungal cream. Pt verbalized understanding and agreed to plan.   MDM   Final diagnoses:  None  1. Candidal skin infection  Exam c/w very mild candidal infection. Foreskin easily retracted. No difficulty urinating. Topical medications provided.  I personally performed the services described in this  documentation, which was scribed in my presence. The recorded information has been reviewed and is accurate.       Charlann Lange, PA-C 08/18/15 [place], MD 08/21/15 [phone]
